# Patient Record
Sex: Female | Born: 1970 | Race: White | Hispanic: No | Marital: Married | Smoking: Never smoker
Health system: Southern US, Community
[De-identification: ages and names within clinical notes are randomized; demographics above are authoritative.]

## PROBLEM LIST (undated history)

## (undated) DIAGNOSIS — F419 Anxiety disorder, unspecified: Secondary | ICD-10-CM

## (undated) DIAGNOSIS — E282 Polycystic ovarian syndrome: Secondary | ICD-10-CM

## (undated) DIAGNOSIS — Z8052 Family history of malignant neoplasm of bladder: Secondary | ICD-10-CM

## (undated) DIAGNOSIS — R51 Headache: Secondary | ICD-10-CM

## (undated) DIAGNOSIS — Z808 Family history of malignant neoplasm of other organs or systems: Secondary | ICD-10-CM

## (undated) DIAGNOSIS — R091 Pleurisy: Secondary | ICD-10-CM

## (undated) DIAGNOSIS — IMO0002 Reserved for concepts with insufficient information to code with codable children: Secondary | ICD-10-CM

## (undated) DIAGNOSIS — N979 Female infertility, unspecified: Secondary | ICD-10-CM

## (undated) DIAGNOSIS — G43909 Migraine, unspecified, not intractable, without status migrainosus: Secondary | ICD-10-CM

## (undated) DIAGNOSIS — R519 Headache, unspecified: Secondary | ICD-10-CM

## (undated) HISTORY — PX: COLON SURGERY: SHX602

## (undated) HISTORY — DX: Reserved for concepts with insufficient information to code with codable children: IMO0002

## (undated) HISTORY — DX: Headache, unspecified: R51.9

## (undated) HISTORY — DX: Pleurisy: R09.1

## (undated) HISTORY — DX: Polycystic ovarian syndrome: E28.2

## (undated) HISTORY — PX: EXPLORATORY LAPAROTOMY: SUR591

## (undated) HISTORY — DX: Family history of malignant neoplasm of bladder: Z80.52

## (undated) HISTORY — DX: Female infertility, unspecified: N97.9

## (undated) HISTORY — DX: Migraine, unspecified, not intractable, without status migrainosus: G43.909

## (undated) HISTORY — DX: Anxiety disorder, unspecified: F41.9

## (undated) HISTORY — DX: Family history of malignant neoplasm of other organs or systems: Z80.8

## (undated) HISTORY — DX: Headache: R51

---

## 1998-05-31 ENCOUNTER — Other Ambulatory Visit: Admission: RE | Admit: 1998-05-31 | Discharge: 1998-05-31 | Payer: Self-pay | Admitting: Obstetrics & Gynecology

## 1999-05-31 ENCOUNTER — Encounter (INDEPENDENT_AMBULATORY_CARE_PROVIDER_SITE_OTHER): Payer: Self-pay

## 1999-05-31 ENCOUNTER — Other Ambulatory Visit: Admission: RE | Admit: 1999-05-31 | Discharge: 1999-05-31 | Payer: Self-pay | Admitting: Gastroenterology

## 2000-01-10 ENCOUNTER — Other Ambulatory Visit: Admission: RE | Admit: 2000-01-10 | Discharge: 2000-01-10 | Payer: Self-pay | Admitting: Obstetrics & Gynecology

## 2004-06-26 ENCOUNTER — Other Ambulatory Visit: Admission: RE | Admit: 2004-06-26 | Discharge: 2004-06-26 | Payer: Self-pay | Admitting: Obstetrics & Gynecology

## 2008-05-20 ENCOUNTER — Inpatient Hospital Stay (HOSPITAL_COMMUNITY): Admission: AD | Admit: 2008-05-20 | Discharge: 2008-05-23 | Payer: Self-pay | Admitting: Obstetrics & Gynecology

## 2008-05-24 ENCOUNTER — Encounter: Admission: RE | Admit: 2008-05-24 | Discharge: 2008-06-08 | Payer: Self-pay | Admitting: Obstetrics & Gynecology

## 2008-05-29 ENCOUNTER — Inpatient Hospital Stay (HOSPITAL_COMMUNITY): Admission: AD | Admit: 2008-05-29 | Discharge: 2008-06-10 | Payer: Self-pay | Admitting: Obstetrics and Gynecology

## 2008-05-30 ENCOUNTER — Encounter: Payer: Self-pay | Admitting: Obstetrics and Gynecology

## 2008-06-01 ENCOUNTER — Encounter (INDEPENDENT_AMBULATORY_CARE_PROVIDER_SITE_OTHER): Payer: Self-pay | Admitting: Obstetrics and Gynecology

## 2010-01-09 IMAGING — CT CT ABDOMEN W/ CM
3 of 4 series · 13 of 32 positions shown, 18 images · IV contrast (40ML OMNI-MIX & 150ml omni/300%)
Comparison: None

CT ABDOMEN

CLINICAL DATA: Abdominal pain

CT ABDOMEN AND PELVIS WITH CONTRAST
TECHNIQUE: Multidetector CT imaging of the abdomen and pelvis was
performed using the standard protocol following bolus
administration of intravenous contrast.
Contrast: 150 ml of omni 300

[Series 2: abd pelvis · axial · 0.75mm/px · z∈[-380,-85]mm · 4 of 96 slices shown, 9 images]
[im 20/96  soft-tissue]
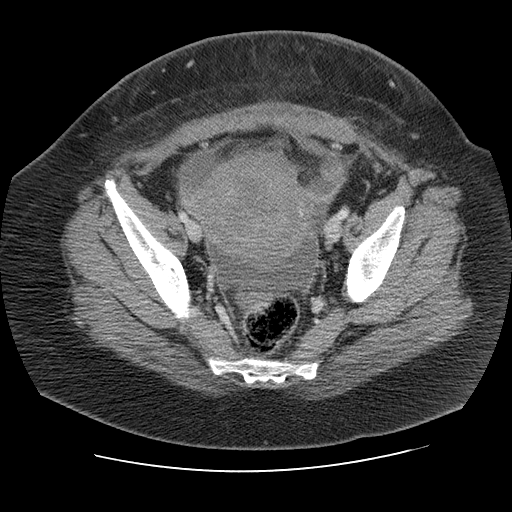
[im 20/96  lung]
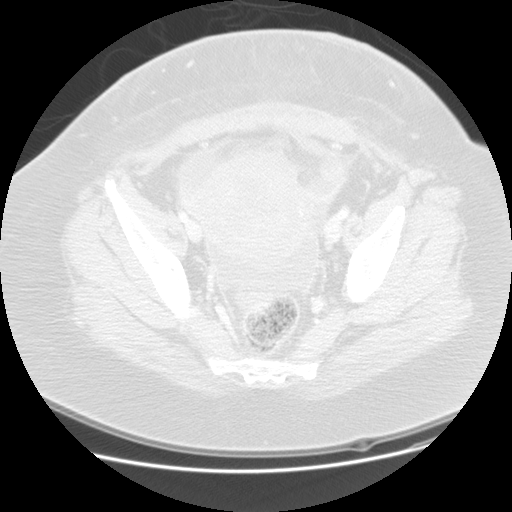
[im 20/96  bone]
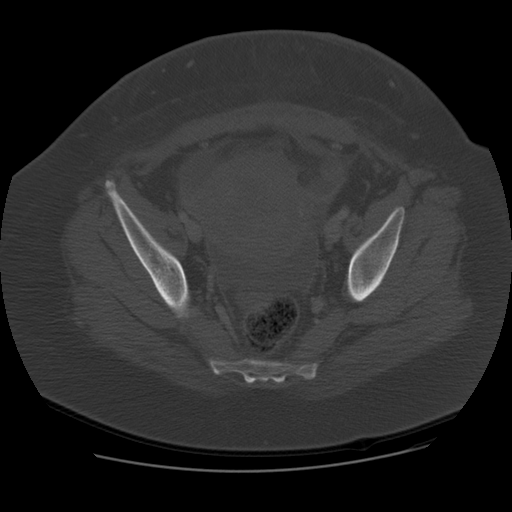
[im 39/96  soft-tissue]
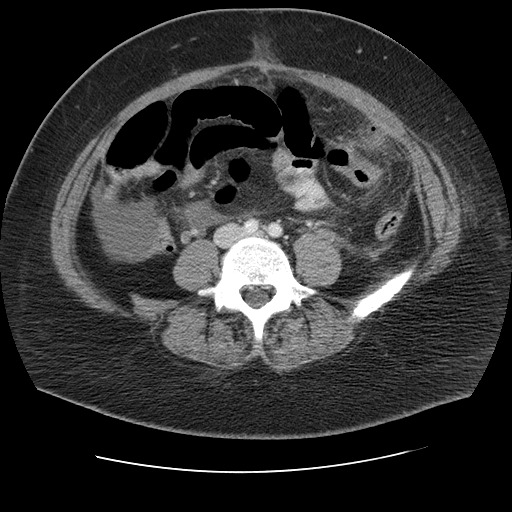
[im 39/96  lung]
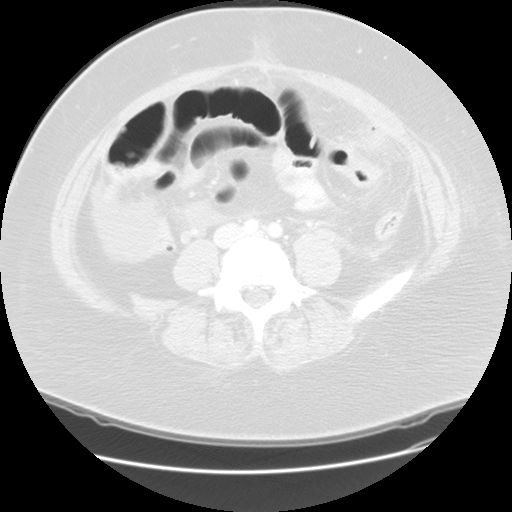
[im 58/96  soft-tissue]
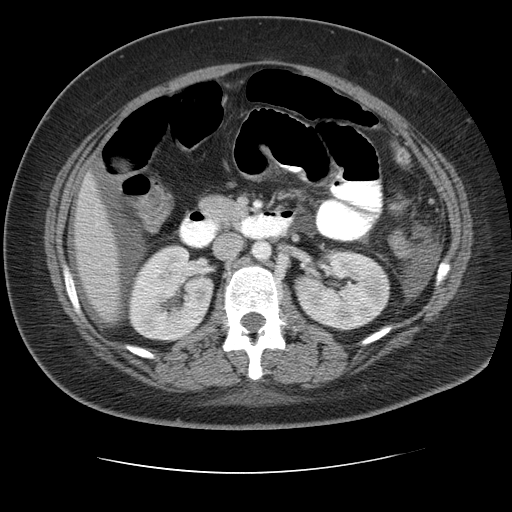
[im 58/96  lung]
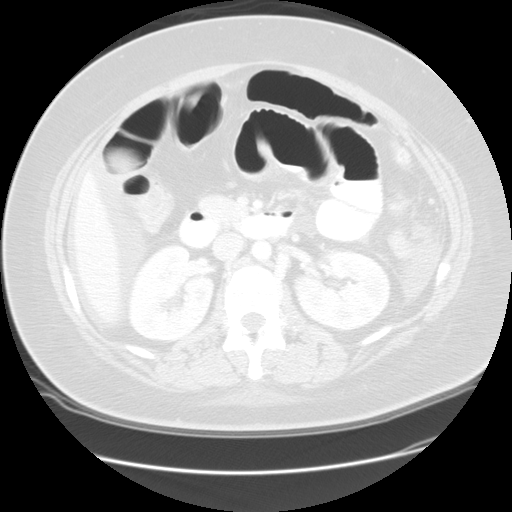
[im 77/96  soft-tissue]
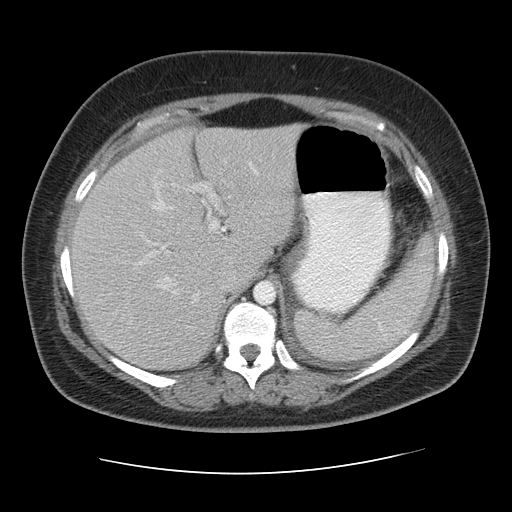
[im 77/96  lung]
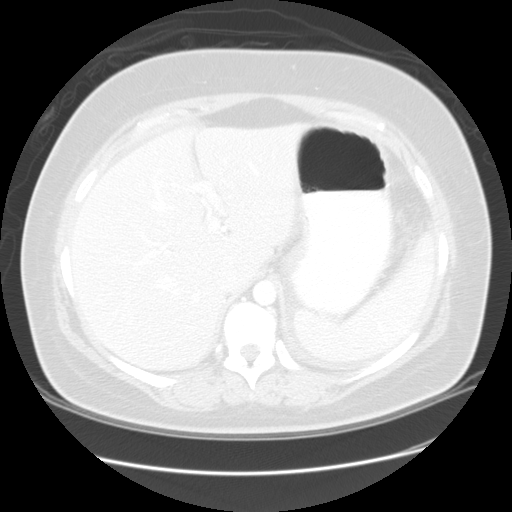

[Series 5: renal delay · axial · delayed · 0.70mm/px · z∈[-335,-230]mm · 2 of 65 slices shown]
[im 22/65  soft-tissue]
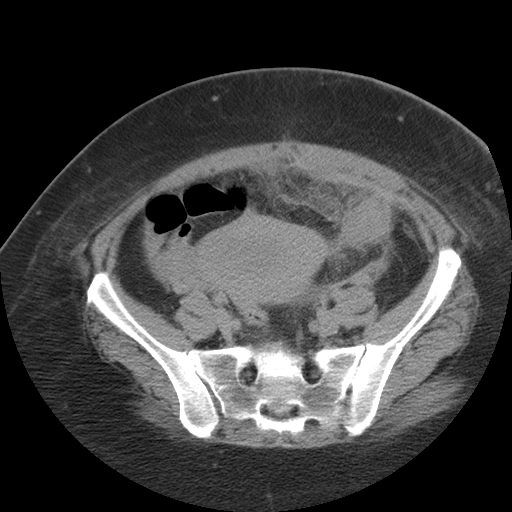
[im 43/65  soft-tissue]
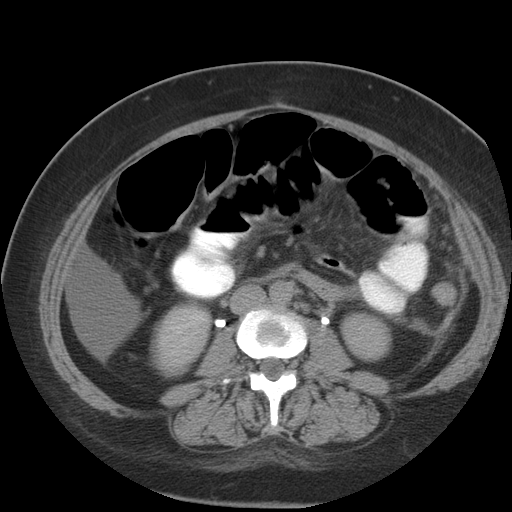

[Series 401: reformatted · sagittal · 1.00mm/px · 7 of 190 slices shown]
[im 18/190  soft-tissue]
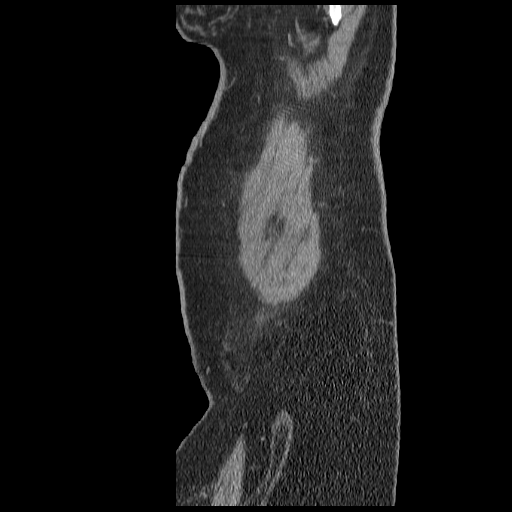
[im 35/190  soft-tissue]
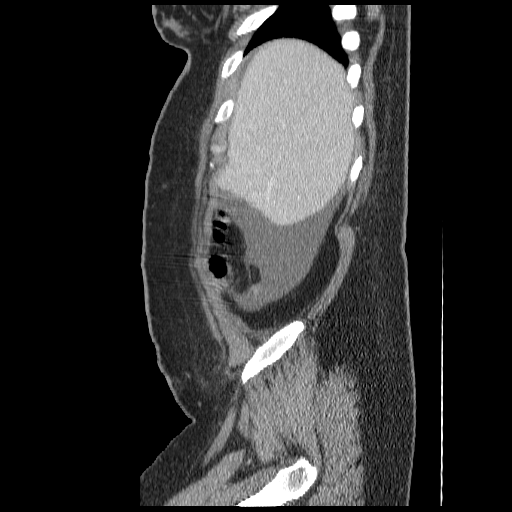
[im 69/190  soft-tissue]
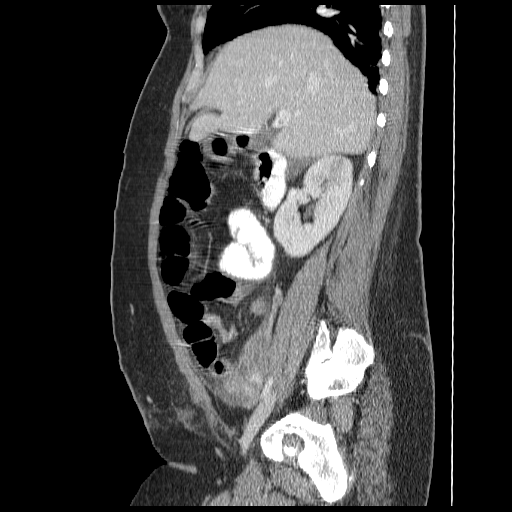
[im 86/190  soft-tissue]
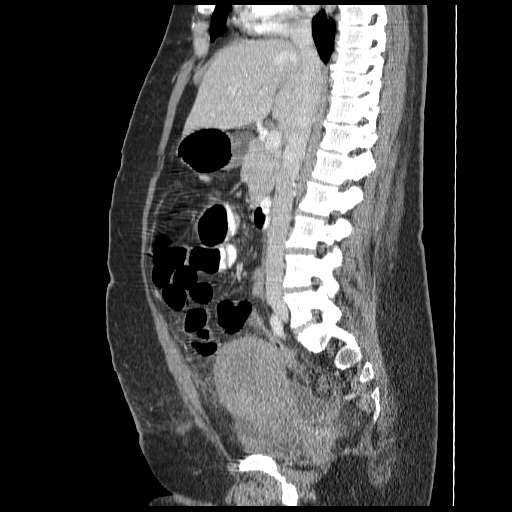
[im 104/190  soft-tissue]
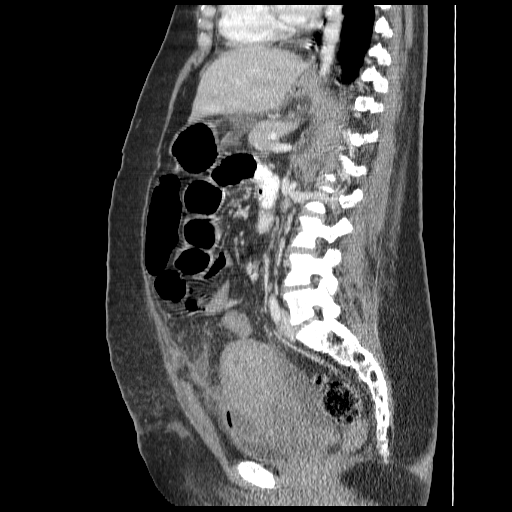
[im 121/190  soft-tissue]
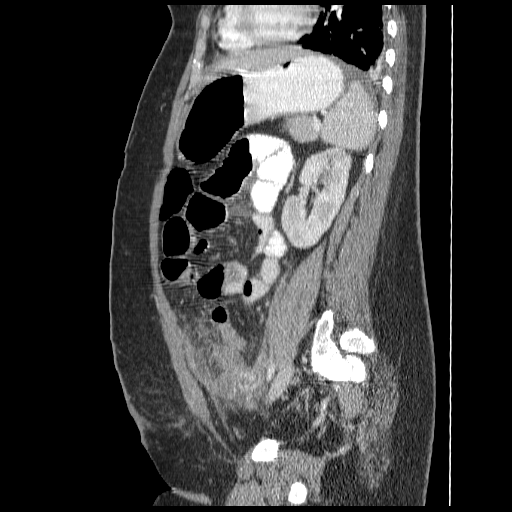
[im 155/190  soft-tissue]
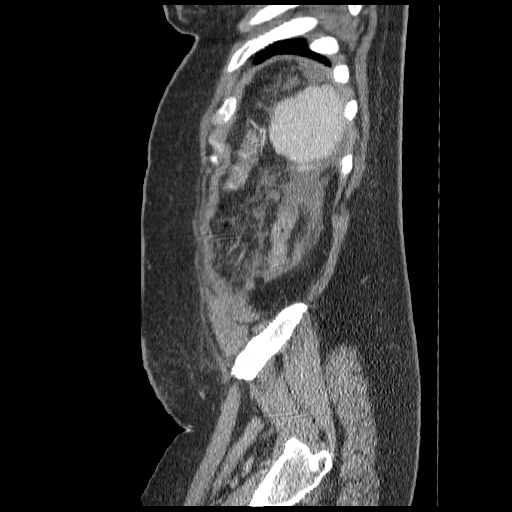

[13 of 32 positions shown; findings below may reference images not displayed]

FINDINGS: Dependent changes are noted at the lung bases

The liver is normal.

A moderate amount of peri hepatic ascites is noted.

The splenic parenchyma is negative.  There is perisplenic ascites.

Pancreas normal.

Gallbladder normal.

No biliary ductal dilatation.

The adrenal glands are normal.

Both kidneys are unremarkable.

The upper abdominal bowel loops are increased in caliber small
bowel loops measure up to 3.8 cm in diameter.

Within the left lower quadrant there is a peripherally enhancing
fluid collection which measures 3.9 x 3.0 cm.  This is along the
undersurface of the left lower quadrant abdominal wall.
IMPRESSION: 1.  Left lower quadrant fluid collection likely represents an
abscess.
2.  Postoperative ileus versus bowel obstruction.
3.  Diffuse upper abdominal ascites.

CT PELVIS
FINDINGS: There is moderate loculated fluid within the cul-de-sac.
This fluid collection has peripherally enhancing margins measuring
7.5 x 2.8 cm.

Along the undersurface of the ventral wall of the pelvis there is a
1.6 x 5.6 cm fluid collection.

A third abscess is identified between the posterior wall of the
urinary bladder and the anterior wall of the uterus.  This is best
seen on the sagittal image measuring 1.9 x 4.7 cm.

The pelvic bowel loops are unremarkable.

The uterus has an unremarkable appearance for the early postpartum
state.

There is no pelvic mass or adenopathy.
IMPRESSION: 1.  There are several focal fluid collections identified which are
worrisome for abscess sees.  The largest is in the cul-de-sac
followed by a thin fluid collection which extends along the
undersurface of the ventral pelvic wall. I also suspect there is an
abscess between the urinary bladder and uterus.  2.  Ascites.

Critical test results telephoned to Dr. Neftaly at the time of
interpretation on 05/29/2008 at 11 pm.

## 2010-12-19 NOTE — Op Note (Signed)
Toni Bowman, Toni Bowman              ACCOUNT NO.:  [id number]   MEDICAL RECORD NO.:  [id number]          PATIENT TYPE:  INP   LOCATION:  9103                          FACILITY:  [facility]   PHYSICIAN:  Gerrit Friends. Aldona Bar, M.D.   DATE OF BIRTH:  [birth year]   DATE OF PROCEDURE:  05/20/2008  DATE OF DISCHARGE:                               OPERATIVE REPORT   The patient's age 40   PREOPERATIVE DIAGNOSIS:  A 38-week intrauterine pregnancy, failure to  progress in labor.   POSTOPERATIVE DIAGNOSIS:  A 38-week intrauterine pregnancy, failure to  progress in labor plus delivery of 6-pound and 14 ounces female infant,  Apgars 9 and 9.   PROCEDURE:  Primary low transverse cesarean section.   SURGEON:  Gerrit Friends. Aldona Bar, MD   ANESTHESIA:  Epidural.   HISTORY:  This 40 year old gravida 1, para 0 was admitted on the morning  of May 20, 2008, at 70 weeks' gestation with spontaneous rupture of  membranes.  At the time of admission, her cervix was 2-3 cm dilated with  vertex at -3 station.  The patient had a positive strep culture in her  urine from urinalysis early in pregnancy and was started on Ancef  because of an allergy to PENICILLIN.  She was started on Pitocin  augmentation and eventually had an IUPC placed to document contractions.  During the day of May 20, 2008, she labored, but progressed no  further than 4-cm of dilatation with vertex remaining at -2 to -3  station.  When checked again at about 7:30 p.m., the cervix did not  change and unfortunately the anterior lip of the cervix was thickening  and although fetal heart remained reactive.  Because of the patient's  failure to progress, decision was made to proceed to delivery by low  transverse cesarean section.   The patient was taken to the operating room with Foley catheter in place  and her epidural was augmented.  Once she was placed on the operating  table and good anesthetic levels were documented, she was prepped and  draped  in usual fashion and procedure was begun.   A Pfannenstiel incision was made with minimal difficulty dissected down  through the subcutaneous tissue to fascia, which was incised in low  transverse fashion as well.  Subfascial space created inferiorly and  superiorly, muscles were separated in the midline and peritoneum was  identified and entered appropriately with care taken to avoid the bowel  superiorly and the bladder inferiorly.  At this time, the vesicouterine  peritoneum was identified and incised in low transverse fashion, pushed  off the lower uterine segment with ease, and then sharp incision into  the uterus in a low transverse fashion was made, and extended laterally.  Delivery of viable female infant, which cried spontaneously once it was  carried out, the vertex position with the aid of the vacuum extractor.  After, the cord was clamped and cut, the infant was passed off the  awaiting team handed up by Dr. Alison Murray and ultimately taken to the  nursery in good condition.  Subsequent weight was found to  be 6 pounds  and 14 ounces, the infant was female, and Apgars were 9 and 9.   Placenta was delivered intact and placenta was passed off as the patient  was a cord blood donor.  The uterus at this time was exteriorized and  rendered free of any remaining products of conception.  Then, the  uterine incision was closed using a single layer of #1 Vicryl in a  running locking fashion and this was oversewn with several figure-of-  eight #1 Vicryl as well.  Additional hemostasis was achieved with  additional suture placement and with using the Bovie.  Once the incision  was noted to be well approximated and dry.  The tubes and ovaries were  noted to be normal.  The abdomen was lavaged of all free blood and clot  and the uterus was replaced in the abdominal cavity.  All counts at this  time were noted to be correct and no foreign bodies were noted be  remaining in the abdominal cavity.   At this time, closure of the abdomen  was carried out in layers.  The abdominal peritoneum was closed with 0  Vicryl in a running fashion.  Muscle secured with same.  Assured of good  subfascial hemostasis.  The fascia was then reapproximated using 0  Vicryl from angle to midline bilaterally.  The subcutaneous tissues were  rendered hemostatic and reapproximated using 2-0 plain in an interrupted  fashion.  The skin was then closed with staples and sterile pressure  dressing was applied.  The patient at this time was transported to  recovery in satisfactory condition, having tolerated the procedure well.  Estimated blood loss 700 mL.  All counts correct x2.  The conclusion of  procedure both mother and baby were doing well in the respective  recovery areas.   IN SUMMARY:  This patient presented with spontaneous rupture of  membranes at 38 weeks' gestation in spite of Pitocin augmentation failed  to progress in labor.  She has got approximately 4-5 cm of dilatation at  which time her cervix became more edematous and the vertex never  descended below -3, -2 station.  She also was taken to the operating for  primary low transverse cesarean section with delivery of 6 pounds and 14  ounces female infant with good Apgars.  All counts correct x2.  Estimated blood loss 700 mL.      Gerrit Friends. Aldona Bar, M.D.  Electronically Signed     RMW/MEDQ  D:  05/20/2008  T:  05/21/2008  Job:  [id number]

## 2010-12-19 NOTE — Consult Note (Signed)
[name], [name]              ACCOUNT NO.:  [id number]   MEDICAL RECORD NO.:  [id number]          PATIENT TYPE:  INP   LOCATION:  9371                          [facility]ACILITY:  [facility]   PHYSICIAN:  Toni Pancoast. Bowman, M.D.DATE OF BIRTH:  1971/02/27   DATE OF CONSULTATION:  05/29/2008  DATE OF DISCHARGE:                                 CONSULTATION   HISTORY:  Toni Bowman is a 40 year old Caucasian female 10 days after  a C-section for her first pregnancy who was discharged in approximately  2 or 3 day after her C-section and states that she just kind of done  poorly, soreness predominantly lower abdominal on the left and the  right.  Bowels have been quite sluggish and has been on stool softeners.  She states she thinks she actually had a chill probably Tuesday night  and then today, she obviously felt worse and then came to the emergency  room here at [facility] approximately 4 p.m.  She was seen by Dr.  Edward Bowman and she was definitely tender in the lower abdomen and also tender  guarding the upper abdomen and laboratory studies were obtained.  White  count however was only 7100, and her hematocrit was 30.6 and hemoglobin  of 10.  The patient preoperatively before her section had a hematocrit  of 34 and then postoperative 2 days over 24 hour after section, her  hematocrit was 27.  The patient was given pain medication and then a CT  of the abdomen and pelvis was performed and that shows lot of fluid in  the pelvis.  It looks like it is probably blood is definite inflammatory  changes more in the left lower quadrant and in the posterior pelvis  area.  The area has a little fluid in the upper abdomen.  The nature of  the fluid on the CT by the radiologist says it is not acute blood, might  __________ probably 15 or something.   Her vital signs when she first arrives her temperature of 101.7, pulse  is 101, and blood pressure 111/53.  She has now received 2 units of IV  fluids and  her temperature was 100.1, blood pressure 133/53, and her  pulse of 90.  She has received pain medication several occasions and I  have called and talked with the radiologist.  Both the radiologist and I  feel that the fluid that we are seeing looks like to me old blood and  there is a little bit more changes like possible development of abscess  predominately in the left lower quadrant.  The patient states that the  pain has kind of gradually increased.  It has gotten much worse today,  but it is not like she was feeling fine yesterday and then suddenly  today is quite sore as if she has a perforation of her intestines and  also on the CT, there was no evidence of any extraluminal free air.  There was no evidence of any diverticulitis, appendicitis, or any acute  changes of the bowel that we can actually see if she does have kind of  an ileus pattern, however.  I think then I would load her up with  antibiotics.  She is allergic to PENICILLIN and we have started the  Cipro at this time plus we have given her Flagyl and we are going to  recheck a CBC.  I think in the morning, she isgoing to need to be  reassessed and hopefully, she will be better.  If she is better than I  would see if we can sample one of these fluid collections in the pelvis  for culture and sensitivity and just continue with the antibiotics.  If  she becomes hemodynamically unstable or she is not improving on 6-8  hours of antibiotics, then the possibility of a laparotomy definitely  may be indicated.  I think that the amount of blood that we are seeing  that it is unlikely that a laparoscopic examination is going to allow [place]  to inspect everything, it could certainly possibly first be attempted,  but most likely would need to be a full laparotomy.  The patient and her  mother have been informed of our opinions and are quite  hopeful that surgery will not be needed.  I will follow along and we  will recheck her in the  morning and please feel free to call me if her  condition worsens this evening and I will assist on the laparotomy.   IMPRESSION:  Probably infected hematoma now 10 days following her C-  section for first pregnancy.           ______________________________  Toni Pancoast. Zachery Bowman, M.D.     WJW/MEDQ  D:  05/30/2008  T:  05/30/2008  Job:  [id number]

## 2010-12-19 NOTE — Op Note (Signed)
[name], [name]              ACCOUNT NO.:  [id number]   MEDICAL RECORD NO.:  [id number]         PATIENT TYPE:  WINP   LOCATION:                                [facility]   PHYSICIAN:  Anselm Pancoast. 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   ASSISTANTS:  Miguel Aschoff, M.D. and Randye Lobo, MD, they were both  scrubbed in.   HISTORY:  Toni Bowman is a 40 year old Caucasian female who is now  approximately 12 days following a C-section and was readmitted to [facility] on Saturday afternoon when she had increasing abdominal pain.  Her history is that she was discharged in about the second day.  She has  always had a little more pain in the left lower quadrant and problems  with poor bowel function, but her pain became significantly increased on  Saturday and she came to the emergency room here at [facility] on  Saturday afternoon.  She was seen by Dr. Conley Simmonds who was on call.  Dr. Aldona Bar had actually done her suction, and her wound looked fine.   LABORATORY STUDIES:  White count was not even elevated, but she did have  a kind of a mild tachycardia and she was definitely tender in all 4  quadrants of her abdomen.  A plain abdominal film was performed, which  showed that she was gaseous.  Then, a CT was performed and the CT showed  fluid collections in the left pelvis behind the uterus, a little bit in  the right and the left and right upper abdomen, which the density was  not that of blood and her area adjacent to the sigmoid colon, there was  a little pocket that looked like there was a little rim around it like  pus.  This was truly an abscess.  It appeared to be separate from both  the colon and her left tube and ovary, but it was  a kind of down in the  genital area.  We started her on antibiotics, and I saw her  approximately at midnight and on review of the CTs and etc., felt that  this was most likely a peritonitis whether it was an intestinal or a  post GYN infection I could not tell, but I felt that the best would be  to have a percutaneous sampling of the fluid done the next day and this  was arranged and done at [facility].   The Gram-stain results were not reported and I talked to the technician  the following day, and she said that they could see what they thought  was bacteria, but they could not tell.  So, therefore they did not  report anything, and then at the first reading, which I think it was  about 12 hours after the plate in, they were stating it was no growth.  She was placed on Cipro and  Flagyl, being allergic to penicillin.  I saw  her approximately 24 hours later, and she states she was feeling better.  She thought her pain was about 7 or it had been 10, and she was trying  sips of liquids and she was actually passing a little bit of flatus.  The drainage, however, felt smell, as it even had colonic organisms in  it, and I sent a second culture from the [place] reservoir and this today  showing both Gram-positive and Gram-negative organisms.   She had some fever last night.  Her white count has been slightly  elevated since the percutaneous drainage and with no improvement, the  bacteria of multiple organisms, I felt that an exploratory laparotomy  was indicated.  I talked with both the patient, her parents, and husband  about the findings reviewed the CTs and then she might need a colostomy.  Dr. Edward Jolly who has been seeing her all of this hospitalizations, saw her  again today and Dr. Tenny Craw, gynecologist on call and we are all in  agreement that a laparotomy is needed.  The patient was taken to surgery  after signing the permit and the time-outs etc. are completed.  She is  on Cipro and  Flagyl, and the lower Pfannenstiel incision appears to be  healing nicely.   The patient was positioned on the OR table after induction of general  anesthesia, her endotracheal tube was little difficult to in place and  then the Optiview was used and then a nasogastric tube was placed into  the stomach.  A Foley catheter was inserted and the abdomen was prepped  widely where the drain is in the left lower quadrant.  We left the drain  in place, but removed the little attaching device and prepped it in the  field.  Upon opening the midline incision which I did and she has got  PIA stockings, we carefully entered into the peritoneal cavity and they  reassessed kind a thin fluid.  It does not really smell that feculent  and most of the fluid was in the left lower quadrant and that was frank  infection type like you say were ruptured appendicitis, but is  predominantly in the left lower quadrant.   The inspection of where the drain was placed, the drain was adjacent to  the colon, but it is definitely not into the colon and we cannot see any  obvious areas of drainage or hole in the colon from the drain or  definite diverticulum at this.  I tagged the area with a stitch, and  then went through a kind of general expiration.  The most of the fluid  in the pelvis which was cultured aerobic and anaerobically was washed  out, and then she had probably a liter or half liter of kind of thinner  fluid in the left upper quadrant and right upper quadrant.  In the right  lower quadrant, was the same fluid, but the pathology certainly appears  to be in the left lower quadrant.  We then find the appendix, it is  normal looking at the uterus both from the front and posterior.  The  suture line appears to be healing nicely.  The uterus is a little bit  spongy, but nothing that we think is obviously an infection and the  right tube and the left tube and ovaries look like we think normal for a  10-12 day  postpartum in a 40 year old female.   Then, we  have run the small bowel, it is normal, and the little peel  from around the distal area of the colon that had been so well,  demonstrated on original CT.  In looking at that, there is a little  area, that looks like a single diverticulum off the colon that looks  like there was a little opening, kind a going at, took TA 60 stapler and  fired it and removed it, and I think there is a little diverticulum in  this.  I then invaginated this little short about a cm and a half staple  line with Lembert sutures with 3-0 silk, and asked Dr. Colin Benton to scrub in  to get her assessment.  The options, of course ,would be to go ahead and  do a limited resection and colostomy on Hartmann or is it, is where we  think the problem is, since we are not seeing any further contamination,  etc.  Could this be stopped at this point, leave a drain in, and keep  her on broad antibiotics.   Dr. Colin Benton feels that colostomy is not indicated also, and therefore we  washed everything thoroughly.  I did put a Blake drain in lateral view  of omentum, which was placed back over this area of the sigmoid colon  and the small bowel I think is an anatomical position.  The NG tube well  in the stomach.  We had inspected and looked at the duodenum.  We are  doing it through a lower incision and she is a large woman, so I could  not get the best inspection of the upper abdomen, but we are certain all  the pathology appears to be in the pelvis.  The midline incision was  then closed with the looped double 0 PDS, and then the skin was loosely  approximated with staples and packed with iodoform gauze.  It was  basically left open, and a sterile occlusive dressing applied.   The drain had been sutured to the skin and it was hooked up to the  reservoir.  Estimated blood loss was probably about 50 mL and the  hemoglobin is down.  I think this is going to be more related to the  chronic  nature in the recent postpartum.  I do not think that she has  got active bleeding at this time.  The patient will go back up to the  ICU.  She  will keep the NG tube in until she is definitely has a soft abdomen, no  evidence of any gaseous abdominal distention, and the family understands  that if we were wrong and there is stool drainage from the drain, we  will have no choice, but take her back and do end-colostomy Hartmann,  which hopefully will not be needed.           ______________________________  Anselm Pancoast. Zachery Dakins, M.D.     WJW/MEDQ  D:  06/01/2008  T:  06/02/2008  Job:  [id number]   cc:   Randye Lobo, M.D.  Fax: [phone]

## 2010-12-22 NOTE — Discharge Summary (Signed)
Toni Bowman, Toni Bowman              ACCOUNT NO.:  [id number]   MEDICAL RECORD NO.:  [id number]          PATIENT TYPE:  INP   LOCATION:  9103                          [facility]ACILITY:  [facility]   PHYSICIAN:  Malva Limes, M.D.    DATE OF BIRTH:  Jul 20, 1971   DATE OF ADMISSION:  05/20/2008  DATE OF DISCHARGE:  05/23/2008                               DISCHARGE SUMMARY   FINAL DIAGNOSES:  Intrauterine pregnancy at [redacted] weeks gestation,  spontaneous rupture of membranes, failure to progress, positive group B  strep.   PROCEDURE:  Primary low-transverse cesarean section.   SURGEON:  Gerrit Friends. Aldona Bar, MD   COMPLICATIONS:  None.   This is 40 year old G1, P0 presents at [redacted] weeks gestation with  spontaneous rupture of membranes.  The patient's antepartum course up to  this point had been complicated by positive group B strep culture that  was found on her urine culture in her first trimester and the patient  was also advanced maternal age.  She had a first trimester screen that  was within normal limits, but did not have amniocentesis, otherwise the  patient's antepartum course has been uncomplicated.  The patient was  admitted on May 20, 2008, with rupture of membranes.  Cervix was re-  dilated to 2-3 cm at a -3 station.  The patient was started on Ancef  secondary to positive group B strep culture and her allergy to  PENICILLIN.  The patient was also started on Pitocin for augmentation.  The patient's labor __________but cervix did not dilate any further than  4 cm.  By 7:30 that night, there was still no change.  The anterior lip  of the cervix was thickening.  Fetal heart tones remained stable, but  because of the failure to progress, the decision was made to proceed  with a cesarean section.  The patient was taken to the operating room on  May 20, 2008, by Dr. Annamaria Helling where a primary low-transverse  cesarean section was performed with the delivery of a 6-pound 14-ounce  female infant  with Apgars of 9 and 9.  The delivery went without  complications.  The patient's postoperative course was benign without  any significant fevers.  The patient did have some symptoms of  questionable postpartum depression.  She was sent home on postoperative  day #3 on a regular diet, told to decrease activities.  She was given a  prescription for Zoloft 50 mg to start daily to help with this  postpartum depression, given Percocet 1-2 every 4-6 hours as needed for  her pain, and was to follow up in our office in 4 weeks.  Instructions  and precautions were reviewed with the patient.   LABORATORY DATA:  On discharge, the patient had a hemoglobin of 9.4,  white blood cell count of 15.2, and platelets of 169,000.      Toni Bowman, P.A.-C.      ______________________________  Malva Limes, M.D.    MB/MEDQ  D:  07/06/2008  T:  07/07/2008  Job:  [id number]

## 2010-12-22 NOTE — Discharge Summary (Signed)
NAMEDAVINIA, RICCARDI              ACCOUNT NO.:  [id number]   MEDICAL RECORD NO.:  [id number]          PATIENT TYPE:  INP   LOCATION:  9320                          FACILITY:  [facility]   PHYSICIAN:  Anselm Pancoast. Weatherly, M.D.DATE OF BIRTH:  07-07-1971   DATE OF ADMISSION:  05/29/2008  DATE OF DISCHARGE:  06/10/2008                               DISCHARGE SUMMARY   DISCHARGE DIAGNOSES:  Intraabdominal infection, status post cesarean  section.  Etiology of infection is probably microperforation of her  sigmoid colon.  The patient is approximately 10 days post C-section.   OPERATIVE PROCEDURE:  Exploratory laparotomy, drainage of left pelvic  abscess, and suture repair area of sigmoid colon.   HISTORY:  Toni Bowman is a 40 year old Caucasian female who is  readmitted 10 days after a C-section to [facility] on a Saturday  evening with severe increase in left lower abdominal pain.  She stated  that she is always had more pain on the left than the right side of her  abdominal and with this increasing in intensity and fever, she was  admitted on a Saturday evening, seen in the ER by Dr. Conley Simmonds, Dr.  Aldona Bar who had actually done her section.  The patient had a mild  tachycardia.  White count was not elevated initially and plain abdominal  films performed, which showed her to have an extremely gaseous colon.  A  CT was performed, which showed fluid collections in the left pelvis,  behind the uterus, a little fluid on the right and the density, it was  felt that not to be blood, but it was not really an obvious walled off  abscess.  The patient was started on antibiotics.  The following day had  an aspiration of fluid by the radiologist, which was sent to [facility].  The Gram stain was not reported, but the following day, we  could see bacteria.  She was on Cipro and Flagyl.  I saw her back 24  hours later, I had seen her originally in the emergency room and she had  fever.  White  count was becoming more elevated and the Pfannenstiel  incision appeared to be healing without problems.  She was taken to  surgery and Dr. Edward Jolly assisted and we found fluid in the left pelvis.  It was a little area that look like possibly a little microperforation  of the sigmoid colon in the descending sigmoid colon junction area and I  suture repaired this and had Dr. Colin Benton come in and also explore since we  obviously had bacteria within the fluid that appears to be colonic in  origin and there was no other evidence of diverticulitis or free  perforation that we could identify.  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  I had placed a Blake drain.  Her  white count postoperatively remained in approximately 10,000 range.  The  culture showed Bacteroides and she continued  to have abdominal pain  predominantly on the left and I sent her down for a Gastrografin enema  that showed no evidence of any leakage of the colon where the little  area was repaired.  The drain which had been frankly purulent drainage  initially became clear.  She became afebrile, her pulse improved.  We  had started on a PICC line and we talked about possibly to start on  hyperalimentation but she improved and this was never done.  Her midline  incision appears to be healing nicely and we removed the PICC line and  discharged her on Cipro 500 mg p.o. b.i.d. for 7 days, Flagyl 500 mg  q.i.d. for 7 days and I will see her in the office next Monday.  Check  laboratory studies and remove the Blake drain if there is no increased  drainage.  She has had a significant history of chronic constipation  following the C-section, and I think that the etiology of the infection  was probably a little microperforation of the colon even though that we  could not  definitely identify true hole in the colon at the time of  surgery.  There was an area medially adjacent to the fluid collection.  However, that look like it was the area of perforation that I suture  repaired.  The patient was discharged  in an improved condition and I think since this culture was Bacteroides  and she had no problems with any endometriosis of her uterus following  the C-section that the colon is the most likely origin.  Her appendix  showed no evidence of any inflammation at the time of surgery.      Anselm Pancoast. Zachery Dakins, M.D.  Electronically Signed     WJW/MEDQ  D:  11/15/2008  T:  11/16/2008  Job:  [id number]   cc:   Gerrit Friends. Aldona Bar, M.D.  Fax: [phone]

## 2010-12-22 NOTE — Discharge Summary (Signed)
Toni Bowman, Toni Bowman              ACCOUNT NO.:  [id number]   MEDICAL RECORD NO.:  [id number]          PATIENT TYPE:  INP   LOCATION:  9320                          FACILITY:  [facility]   PHYSICIAN:  Leilani Able, P.A.-C.DATE OF BIRTH:  [birth year]   DATE OF ADMISSION:  05/29/2008  DATE OF DISCHARGE:  06/10/2008                               DISCHARGE SUMMARY   FINAL DIAGNOSES:  Ten-day postop from her C-section, left lower quadrant  pain, abdominal abscess.   COMPLICATIONS:  None.   This 40 year old G1, P1 presents on 10 days postop from a C-section  secondary to failure to dilate.  The patient presents with some burning  in her lower abdomen.  She said she has not been feeling well since  delivery.  Pain has worsened and is now centered in the left lower  quadrant.  She is having some vomiting at this time.  The patient's temp  upon admission is 101.7, blood pressures are within normal limits.  The  patient did have some tenderness with guarding and rebound in her lower  abdomen as well as a tender uterus and adnexa.  Lab work was obtained.  The patient was admitted.  CT of pelvis and abdomen were ordered and a  surgical consult was obtained.  The CT scan showed lots of fluid in the  pelvis, looks like old blood and the possible development of an abscess  in her left lower quadrant.  No signs of anything acute going on and  blood was thought to have been there for a while and nothing is acute.  The patient was hemodynamically stable at this point.  She was started  on IV antibiotics.  She is allergic to PENICILLIN, therefore was started  on Cipro, Flagyl, and Ancef.  The patient is positive group B strep and  penicillin allergy, but tolerated the Ancef in labor.  The patient is  also having some constipation.  The patient does have a drain in her  incision.  She is continued to be watched closely and followed by the  surgical team.  She did have the abscess drained.  She was  continued on  her antibiotics and continues to spike some temperatures despite her  adequate antibiotics.  The patient did have a PICC line placed on  June 04, 2008, that went without complications.  She was continued on  her Cipro and Flagyl.  By June 06, 2008, the patient started to feel  little bit better.  She was able to advance her diet slightly to some  fluid.  PICC line was able to be removed, and the patient was felt ready  for discharge by April 10, 2008.  She had been afebrile for 24 hours.  She was feeling much better.  She was able to tolerate oral Cipro and  Flagyl.  She was sent home with another 7 days of it.  She was to  present on Monday for some more lab work, to use MiraLax as needed for  constipation, and of course to call with any temperatures, increased  pain, or problems.  No surgery was needed,  and the patient was sent home  without any serious complications.  She was to follow up with the  surgical team as directed.   LABORATORY ON DISCHARGE:  By discharge, the patient had a hemoglobin of  7.0, white blood cell count of 8.5 which was down from a high of 15.8,  platelets of 529,000, no shift in her white blood cell count by  discharge, as well as normal liver function tests.  Blood culture did  return showing Propionibacterium species.  Urine culture was negative,  and you can see the abscess cultures are in the chart.      Leilani Able, P.A.-C.     MB/MEDQ  D:  06/22/2008  T:  06/23/2008  Job:  [id number]

## 2011-03-29 ENCOUNTER — Other Ambulatory Visit: Payer: Self-pay | Admitting: Obstetrics and Gynecology

## 2011-04-03 ENCOUNTER — Other Ambulatory Visit: Payer: Self-pay | Admitting: Obstetrics and Gynecology

## 2011-04-03 DIAGNOSIS — R1903 Right lower quadrant abdominal swelling, mass and lump: Secondary | ICD-10-CM

## 2011-04-06 ENCOUNTER — Ambulatory Visit
Admission: RE | Admit: 2011-04-06 | Discharge: 2011-04-06 | Disposition: A | Discharge status: Home / Self Care | Payer: Commercial Indemnity | Source: Ambulatory Visit | Attending: Obstetrics and Gynecology | Admitting: Obstetrics and Gynecology

## 2011-04-06 DIAGNOSIS — R1903 Right lower quadrant abdominal swelling, mass and lump: Secondary | ICD-10-CM

## 2011-04-06 MED ORDER — IOHEXOL 300 MG/ML  SOLN
100.0000 mL | Freq: Once | INTRAMUSCULAR | Status: AC | PRN
  Administered 2011-04-06: 100 mL via INTRAVENOUS

## 2011-04-28 ENCOUNTER — Emergency Department (INDEPENDENT_AMBULATORY_CARE_PROVIDER_SITE_OTHER): Payer: Commercial Indemnity

## 2011-04-28 ENCOUNTER — Encounter: Payer: Self-pay | Admitting: *Deleted

## 2011-04-28 ENCOUNTER — Emergency Department (HOSPITAL_BASED_OUTPATIENT_CLINIC_OR_DEPARTMENT_OTHER)
Admission: EM | Admit: 2011-04-28 | Discharge: 2011-04-28 | Disposition: A | Discharge status: Home / Self Care | Payer: Commercial Indemnity | Attending: Emergency Medicine | Admitting: Emergency Medicine

## 2011-04-28 DIAGNOSIS — M79609 Pain in unspecified limb: Secondary | ICD-10-CM

## 2011-04-28 DIAGNOSIS — M25571 Pain in right ankle and joints of right foot: Secondary | ICD-10-CM

## 2011-04-28 DIAGNOSIS — X58XXXA Exposure to other specified factors, initial encounter: Secondary | ICD-10-CM

## 2011-04-28 DIAGNOSIS — M25579 Pain in unspecified ankle and joints of unspecified foot: Secondary | ICD-10-CM | POA: Insufficient documentation

## 2011-04-28 MED ORDER — IBUPROFEN 600 MG PO TABS: 600.0000 mg | ORAL_TABLET | Freq: Four times a day (QID) | ORAL | Status: AC | PRN

## 2011-04-28 MED ORDER — OXYCODONE-ACETAMINOPHEN 5-325 MG PO TABS: 2.0000 | ORAL_TABLET | ORAL | Status: AC | PRN

## 2011-04-28 NOTE — ED Notes (Signed)
Right ankle hurts in the center of top of foot. Denies any injury, started yesterday afternoon

## 2011-04-28 NOTE — ED Provider Notes (Signed)
History     CSN: [id number] Arrival date & time: 04/28/2011  1:29 AM  Chief Complaint  Patient presents with  . Ankle Pain    HPI    HPI Comments: 40 year old female previous healthy presents with right ankle pain x2 days now 8/10. Patient states that there was no known injury. She began to have a dull aching sensation in her anterior right ankle. Pain is worse with movement and progressively worsening despite ibuprofen use. She remained in the ambulatory. Denies numbness tingling or weakness of her foot. No history of similar. Denies fevers chills swelling redness. No recent illness. No history of gout. Denies other complaints at this time.  Patient is a 40 y.o. female presenting with ankle pain.  Ankle Pain     History reviewed. No pertinent past medical history.  Past Surgical History  Procedure Date  . Cesarean section   . Exploratory laparotomy     No family history on file.  History  Substance Use Topics  . Smoking status: Never Smoker   . Smokeless tobacco: Not on file  . Alcohol Use: No    OB History    Grav Para Term Preterm Abortions TAB SAB Ect Mult Living                  Review of Systems  Review of Systems  All other systems reviewed and are negative.   except as noted in history of present illness  Allergies  Review of patient's allergies indicates no known allergies.  Home Medications   Current Outpatient Rx  Name Route Sig Dispense Refill  . IBUPROFEN 600 MG PO TABS Oral Take 1 tablet (600 mg total) by mouth every 6 (six) hours as needed for pain. 30 tablet 0  . OXYCODONE-ACETAMINOPHEN 5-325 MG PO TABS Oral Take 2 tablets by mouth every 4 (four) hours as needed for pain. 15 tablet 0    Physical Exam    BP 112/58  Pulse 72  Temp(Src) 98.4 F (36.9 C) (Oral)  Resp 20  Wt 210 lb (95.255 kg)  SpO2 100%  LMP 04/14/2011  Physical Exam  Nursing note and vitals reviewed. Constitutional: She is oriented to person, place, and time. She  appears well-developed.  HENT:  Head: Atraumatic.  Mouth/Throat: Oropharynx is clear and moist.  Eyes: Conjunctivae and EOM are normal. Pupils are equal, round, and reactive to light.  Neck: Normal range of motion. Neck supple.  Cardiovascular: Normal rate, regular rhythm, normal heart sounds and intact distal pulses.   Pulmonary/Chest: Effort normal and breath sounds normal. No respiratory distress. She has no wheezes. She has no rales.  Abdominal: Soft. She exhibits no distension. There is no tenderness. There is no rebound and no guarding.  Musculoskeletal: Normal range of motion.       Right ankle- there is no swelling ecchymosis or deformity. There is no erythema. Full range of motion with minimal pain. + Tenderness to palpation anterior ankle, minimal tenderness to palpation anterior to lateral malleolus. DP and PT intact. Gross sensation intact. There is no obvious effusion  Neurological: She is alert and oriented to person, place, and time.  Skin: Skin is warm and dry. No rash noted.  Psychiatric: She has a normal mood and affect.    ED Course  Procedures (including critical care time)  Labs Reviewed - No data to display Dg Ankle Complete Right  04/28/2011  *RADIOLOGY REPORT*  Clinical Data: Right anterior ankle pain and dorsal foot pain.  RIGHT ANKLE -  COMPLETE 3+ VIEW  Comparison: None.  Findings: There is no evidence of fracture or dislocation.  The ankle mortise is intact; the interosseous space is within normal limits.  No talar tilt or subluxation is seen.  Plantar and posterior calcaneal spurs are incidentally noted.  The joint spaces are preserved.  No significant soft tissue abnormalities are seen.  IMPRESSION: No evidence of fracture or dislocation.  Original Report Authenticated By: Tonia Ghent, M.D.    1. Ankle pain, right    MDM 40 year old female presents with right ankle pain. Differential diagnosis includes tendinitis, fracture, osseous lesion, no suspicion of  septic arthritis, gout.  X-ray reviewed and unremarkable. Will send home with ibuprofen and Percocet. Ace wrap and crutches, ice for supportive care. Follow with her primary care doctor at [facility] medicine as needed.  Stefano Gaul, MD         Forbes Cellar, MD 04/28/11 [phone]

## 2011-05-08 LAB — CROSSMATCH

## 2011-05-08 LAB — CBC
HCT: 25.2 — ABNORMAL LOW
HCT: 25.4 — ABNORMAL LOW
HCT: 26.1 — ABNORMAL LOW
HCT: 30 — ABNORMAL LOW
HCT: 30.5 — ABNORMAL LOW
Hemoglobin: 10 — ABNORMAL LOW
Hemoglobin: 10 — ABNORMAL LOW
Hemoglobin: 11.2 — ABNORMAL LOW
Hemoglobin: 7.2 — CL
Hemoglobin: 8.2 — ABNORMAL LOW
Hemoglobin: 8.6 — ABNORMAL LOW
Hemoglobin: 7.7 — CL
MCHC: 32.7
MCHC: 32.7
MCHC: 32.7
MCHC: 32.9
MCHC: 32.3
MCHC: 32.5
MCV: 82.2
MCV: 82.6
MCV: 82.7
MCV: 82.8
MCV: 83
MCV: 83.1
Platelets: 169
Platelets: 381
Platelets: 408 — ABNORMAL HIGH
Platelets: 422 — ABNORMAL HIGH
Platelets: 433 — ABNORMAL HIGH
Platelets: 434 — ABNORMAL HIGH
Platelets: 439 — ABNORMAL HIGH
Platelets: 550 — ABNORMAL HIGH
Platelets: 529 — ABNORMAL HIGH
Platelets: 570 — ABNORMAL HIGH
RBC: 2.72 — ABNORMAL LOW
RBC: 2.72 — ABNORMAL LOW
RBC: 3.04 — ABNORMAL LOW
RBC: 3.08 — ABNORMAL LOW
RBC: 4.1
RBC: 2.65 — ABNORMAL LOW
RBC: 2.84 — ABNORMAL LOW
RDW: 16 — ABNORMAL HIGH
RDW: 16.3 — ABNORMAL HIGH
RDW: 16.4 — ABNORMAL HIGH
RDW: 17 — ABNORMAL HIGH
RDW: 17.4 — ABNORMAL HIGH
RDW: 17.4 — ABNORMAL HIGH
WBC: 12.6 — ABNORMAL HIGH
WBC: 15 — ABNORMAL HIGH
WBC: 15.2 — ABNORMAL HIGH
WBC: 15.8 — ABNORMAL HIGH
WBC: 7.4
WBC: 8.6
WBC: 8.9
WBC: 9.8
WBC: 8.5

## 2011-05-08 LAB — DIFFERENTIAL
Basophils Absolute: 0
Basophils Absolute: 0
Basophils Absolute: 0
Basophils Relative: 0
Basophils Relative: 0
Basophils Relative: 0
Eosinophils Absolute: 0
Eosinophils Absolute: 0
Eosinophils Absolute: 0
Eosinophils Absolute: 0.1
Eosinophils Relative: 0
Eosinophils Relative: 0
Eosinophils Relative: 0
Eosinophils Relative: 0
Eosinophils Relative: 0
Lymphocytes Relative: 14
Lymphocytes Relative: 5 — ABNORMAL LOW
Lymphocytes Relative: 6 — ABNORMAL LOW
Lymphs Abs: 0.6 — ABNORMAL LOW
Lymphs Abs: 1
Lymphs Abs: 2.1
Monocytes Absolute: 0.2
Monocytes Absolute: 0.5
Monocytes Absolute: 0.7
Monocytes Relative: 4
Monocytes Relative: 4
Monocytes Relative: 7
Neutro Abs: 12.2 — ABNORMAL HIGH
Neutro Abs: 6.8
Neutro Abs: 6.8
Neutro Abs: 6.9
Neutrophils Relative %: 69
Neutrophils Relative %: 81 — ABNORMAL HIGH
Neutrophils Relative %: 92 — ABNORMAL HIGH

## 2011-05-08 LAB — COMPREHENSIVE METABOLIC PANEL
ALT: 13
AST: 31
AST: 17
Albumin: 2.4 — ABNORMAL LOW
Albumin: 1.7 — ABNORMAL LOW
Alkaline Phosphatase: 98
Alkaline Phosphatase: 56
BUN: 5 — ABNORMAL LOW
Calcium: 8.5
Chloride: 109
GFR calc Af Amer: >60
GFR calc Af Amer: >60
Potassium: 4.3
Sodium: 139
Sodium: 136
Total Bilirubin: 0.4

## 2011-05-08 LAB — BASIC METABOLIC PANEL
BUN: 4 — ABNORMAL LOW
BUN: 4 — ABNORMAL LOW
BUN: 6
Calcium: 7.8 — ABNORMAL LOW
Calcium: 7.8 — ABNORMAL LOW
Chloride: 104
Chloride: 104
Chloride: 112
Creatinine, Ser: 0.56
Creatinine, Ser: 0.57
GFR calc Af Amer: >60
GFR calc Af Amer: >60
GFR calc Af Amer: >60
GFR calc non Af Amer: >60
GFR calc non Af Amer: >60
Glucose, Bld: 101 — ABNORMAL HIGH
Glucose, Bld: 158 — ABNORMAL HIGH
Potassium: 3.6
Potassium: 3.8
Potassium: 3.8
Sodium: 136
Sodium: 138

## 2011-05-08 LAB — CULTURE, ROUTINE-ABSCESS

## 2011-05-08 LAB — URINALYSIS, ROUTINE W REFLEX MICROSCOPIC
Bilirubin Urine: NEGATIVE
Hgb urine dipstick: NEGATIVE
Protein, ur: NEGATIVE
Specific Gravity, Urine: 1.015

## 2011-05-08 LAB — URINE CULTURE: Culture: NO GROWTH

## 2011-05-08 LAB — ABO/RH: ABO/RH(D): A POS

## 2011-05-08 LAB — WET PREP, GENITAL
Clue Cells Wet Prep HPF POC: NONE SEEN
Yeast Wet Prep HPF POC: NONE SEEN

## 2011-05-08 LAB — RPR: RPR Ser Ql: NONREACTIVE

## 2011-05-08 LAB — CULTURE, BLOOD (ROUTINE X 2)

## 2011-05-08 LAB — ANAEROBIC CULTURE

## 2011-11-17 ENCOUNTER — Encounter (HOSPITAL_BASED_OUTPATIENT_CLINIC_OR_DEPARTMENT_OTHER): Payer: Self-pay | Admitting: *Deleted

## 2011-11-17 ENCOUNTER — Emergency Department (INDEPENDENT_AMBULATORY_CARE_PROVIDER_SITE_OTHER): Payer: Managed Care, Other (non HMO)

## 2011-11-17 ENCOUNTER — Emergency Department (HOSPITAL_BASED_OUTPATIENT_CLINIC_OR_DEPARTMENT_OTHER)
Admission: EM | Admit: 2011-11-17 | Discharge: 2011-11-17 | Disposition: A | Discharge status: Home / Self Care | Payer: Managed Care, Other (non HMO) | Attending: Emergency Medicine | Admitting: Emergency Medicine

## 2011-11-17 DIAGNOSIS — R05 Cough: Secondary | ICD-10-CM | POA: Insufficient documentation

## 2011-11-17 DIAGNOSIS — R059 Cough, unspecified: Secondary | ICD-10-CM | POA: Insufficient documentation

## 2011-11-17 DIAGNOSIS — R0989 Other specified symptoms and signs involving the circulatory and respiratory systems: Secondary | ICD-10-CM | POA: Insufficient documentation

## 2011-11-17 DIAGNOSIS — J40 Bronchitis, not specified as acute or chronic: Secondary | ICD-10-CM

## 2011-11-17 DIAGNOSIS — R0609 Other forms of dyspnea: Secondary | ICD-10-CM | POA: Insufficient documentation

## 2011-11-17 DIAGNOSIS — J069 Acute upper respiratory infection, unspecified: Secondary | ICD-10-CM

## 2011-11-17 MED ORDER — AEROCHAMBER MAX W/MASK MEDIUM MISC
1.0000 | Freq: Once | Status: AC
  Administered 2011-11-17: 1
  Filled 2011-11-17: qty 1

## 2011-11-17 MED ORDER — ALBUTEROL SULFATE HFA 108 (90 BASE) MCG/ACT IN AERS
2.0000 | INHALATION_SPRAY | Freq: Once | RESPIRATORY_TRACT | Status: AC
  Administered 2011-11-17: 2 via RESPIRATORY_TRACT
  Filled 2011-11-17: qty 6.7

## 2011-11-17 MED ORDER — ALBUTEROL SULFATE HFA 108 (90 BASE) MCG/ACT IN AERS: 1.0000 | INHALATION_SPRAY | Freq: Four times a day (QID) | RESPIRATORY_TRACT | Status: DC | PRN

## 2011-11-17 MED ORDER — PREDNISONE 50 MG PO TABS: 50.0000 mg | ORAL_TABLET | Freq: Every day | ORAL | Status: DC

## 2011-11-17 NOTE — Discharge Instructions (Signed)
Bronchitis Bronchitis is a problem of the air tubes leading to your lungs. This problem makes it hard for air to get in and out of the lungs. You may cough a lot because your air tubes are narrow. Going without care can cause lasting (chronic) bronchitis. HOME CARE   Drink enough fluids to keep your pee (urine) clear or pale yellow.   Use a cool mist humidifier.   Quit smoking if you smoke. If you keep smoking, the bronchitis might not get better.   Only take medicine as told by your doctor.  GET HELP RIGHT AWAY IF:   Coughing keeps you awake.   You start to wheeze.   You become more sick or weak.   You have a hard time breathing or get short of breath.   You cough up blood.   Coughing lasts more than 2 weeks.   You have a fever.   Your baby is older than 3 months with a rectal temperature of 102 F (38.9 C) or higher.   Your baby is 3 months old or younger with a rectal temperature of 100.4 F (38 C) or higher.  MAKE SURE YOU:  Understand these instructions.   Will watch your condition.   Will get help right away if you are not doing well or get worse.  Document Released: 01/09/2008 Document Revised: 07/12/2011 Document Reviewed: 06/24/2009 [employer] Patient Information 2012 [employer]. 

## 2011-11-17 NOTE — ED Notes (Signed)
Has had a cold for the past few days, but last night states that she can't "catch her breath".

## 2011-11-17 NOTE — ED Provider Notes (Signed)
History     CSN: [id number]  Arrival date & time 11/17/11  4098   First MD Initiated Contact with Patient 11/17/11 [phone]      Chief Complaint  Patient presents with  . URI    (Consider location/radiation/quality/duration/timing/severity/associated sxs/prior treatment) Patient is a 41 y.o. female presenting with URI. The history is provided by the patient. No language interpreter was used.  URI The primary symptoms include cough and wheezing. Primary symptoms do not include fever or sore throat. Primary symptoms comment: nasal congestion The current episode started 3 to 5 days ago. This is a new problem. The problem has not changed since onset. The cough began 3 to 5 days ago. The cough is new. The cough is non-productive.  Wheezing began yesterday. Wheezing occurs frequently. The wheezing has been rapidly worsening since its onset. The wheezing had no precipitant. The patient's medical history does not include asthma.  Symptoms associated with the illness include congestion. The following treatments were addressed: A decongestant was ineffective. Risk factors: none.    History reviewed. No pertinent past medical history.  Past Surgical History  Procedure Date  . Cesarean section   . Exploratory laparotomy     No family history on file.  History  Substance Use Topics  . Smoking status: Never Smoker   . Smokeless tobacco: Not on file  . Alcohol Use: No    OB History    Grav Para Term Preterm Abortions TAB SAB Ect Mult Living                  Review of Systems  Constitutional: Negative for fever.  HENT: Positive for congestion. Negative for sore throat.   Eyes: Negative.   Respiratory: Positive for cough and wheezing.   Cardiovascular: Negative for chest pain and leg swelling.  Gastrointestinal: Negative.   Genitourinary: Negative.   Musculoskeletal: Negative.   Skin: Negative.   Neurological: Negative.   Hematological: Negative.   Psychiatric/Behavioral:  Negative.     Allergies  Review of patient's allergies indicates no known allergies.  Home Medications   Current Outpatient Rx  Name Route Sig Dispense Refill  . ALBUTEROL SULFATE HFA 108 (90 BASE) MCG/ACT IN AERS Inhalation Inhale 1-2 puffs into the lungs every 6 (six) hours as needed for wheezing. 1 Inhaler 0  . PREDNISONE 50 MG PO TABS Oral Take 1 tablet (50 mg total) by mouth daily. 5 tablet 0    BP 124/64  Pulse 67  Temp(Src) 97.5 F (36.4 C) (Oral)  Resp 18  SpO2 98%  LMP 11/03/2011  Physical Exam  Constitutional: She is oriented to person, place, and time. She appears well-developed and well-nourished.  HENT:  Head: Normocephalic and atraumatic.  Mouth/Throat: Oropharynx is clear and moist. No oropharyngeal exudate.  Eyes: Conjunctivae are normal. Pupils are equal, round, and reactive to light.  Neck: Normal range of motion. Neck supple.  Cardiovascular: Normal rate and regular rhythm.   Pulmonary/Chest: No respiratory distress. She has decreased breath sounds. She has no rales.  Abdominal: Soft. Bowel sounds are normal. There is no tenderness. There is no rebound and no guarding.  Musculoskeletal: Normal range of motion. She exhibits no edema.  Neurological: She is alert and oriented to person, place, and time.  Skin: Skin is warm and dry.  Psychiatric: She has a normal mood and affect.    ED Course  Procedures (including critical care time)  Labs Reviewed - No data to display Dg Chest 2 View  11/17/2011  *RADIOLOGY  REPORT*  Clinical Data: Dyspnea.  Upper respiratory infection.  CHEST - 2 VIEW  Comparison:  06/04/2008  Findings:  The heart size and mediastinal contours are within normal limits.  Both lungs are clear.  The visualized skeletal structures are unremarkable.  IMPRESSION: No active cardiopulmonary disease.  Original Report Authenticated By: Danae Orleans, M.D.     1. Bronchitis       MDM  PERC negative.  Take all medications and follow up with  your family doctor in 2 days return for worsening symptoms        Ada Woodbury K Paije Goodhart-Rasch, MD 11/17/11 [phone]

## 2012-05-26 ENCOUNTER — Other Ambulatory Visit: Payer: Self-pay | Admitting: Physician Assistant

## 2012-05-26 DIAGNOSIS — Z1231 Encounter for screening mammogram for malignant neoplasm of breast: Secondary | ICD-10-CM

## 2012-05-27 ENCOUNTER — Ambulatory Visit
Admission: RE | Admit: 2012-05-27 | Discharge: 2012-05-27 | Disposition: A | Discharge status: Home / Self Care | Payer: Managed Care, Other (non HMO) | Source: Ambulatory Visit | Attending: Physician Assistant | Admitting: Physician Assistant

## 2012-05-27 DIAGNOSIS — Z1231 Encounter for screening mammogram for malignant neoplasm of breast: Secondary | ICD-10-CM

## 2012-06-20 ENCOUNTER — Encounter (HOSPITAL_COMMUNITY): Payer: Self-pay

## 2012-06-20 ENCOUNTER — Emergency Department (HOSPITAL_COMMUNITY): Payer: Managed Care, Other (non HMO)

## 2012-06-20 ENCOUNTER — Emergency Department (HOSPITAL_COMMUNITY)
Admission: EM | Admit: 2012-06-20 | Discharge: 2012-06-20 | Disposition: A | Discharge status: Home / Self Care | Payer: Managed Care, Other (non HMO) | Attending: Emergency Medicine | Admitting: Emergency Medicine

## 2012-06-20 DIAGNOSIS — M79609 Pain in unspecified limb: Secondary | ICD-10-CM | POA: Insufficient documentation

## 2012-06-20 DIAGNOSIS — R079 Chest pain, unspecified: Secondary | ICD-10-CM | POA: Insufficient documentation

## 2012-06-20 DIAGNOSIS — Z79899 Other long term (current) drug therapy: Secondary | ICD-10-CM | POA: Insufficient documentation

## 2012-06-20 DIAGNOSIS — R0602 Shortness of breath: Secondary | ICD-10-CM | POA: Insufficient documentation

## 2012-06-20 DIAGNOSIS — R42 Dizziness and giddiness: Secondary | ICD-10-CM | POA: Insufficient documentation

## 2012-06-20 DIAGNOSIS — R110 Nausea: Secondary | ICD-10-CM | POA: Insufficient documentation

## 2012-06-20 DIAGNOSIS — R5381 Other malaise: Secondary | ICD-10-CM | POA: Insufficient documentation

## 2012-06-20 LAB — CBC WITH DIFFERENTIAL/PLATELET
Basophils Absolute: 0 10*3/uL (ref 0.0–0.1)
HCT: 36.8 % (ref 36.0–46.0)
Hemoglobin: 12 g/dL (ref 12.0–15.0)
Lymphocytes Relative: 26 % (ref 12–46)
Monocytes Absolute: 0.7 10*3/uL (ref 0.1–1.0)
Monocytes Relative: 6 % (ref 3–12)
Neutro Abs: 7.3 10*3/uL (ref 1.7–7.7)
RDW: 13.8 % (ref 11.5–15.5)
WBC: 11 10*3/uL — ABNORMAL HIGH (ref 4.0–10.5)

## 2012-06-20 LAB — BASIC METABOLIC PANEL
CO2: 26 mEq/L (ref 19–32)
Chloride: 104 mEq/L (ref 96–112)
Creatinine, Ser: 0.55 mg/dL (ref 0.50–1.10)

## 2012-06-20 LAB — POCT I-STAT TROPONIN I: Troponin i, poc: 0 ng/mL (ref 0.00–0.08)

## 2012-06-20 NOTE — ED Notes (Signed)
Pt sts cp started at 1545 today and the pain is radiating down her arm.  Pt sts she is sob

## 2012-06-20 NOTE — ED Provider Notes (Signed)
Medical screening examination/treatment/procedure(s) were conducted as a shared visit with non-physician practitioner(s) and myself.  I personally evaluated the patient during the encounter  Acute onset of L sided chest pain radiating into arm causing "heaviness".  Associated with dizziness and nausea.  Resolved on own after a few minutes. No cardiac history, nonsmoker.  TTP L chest wal.  EKG nonischemic.  Troponin negative.  Patient declines CDU observation.  Glynn Octave, MD 06/20/12 2329

## 2012-06-20 NOTE — ED Provider Notes (Signed)
History     CSN: [id number]  Arrival date & time 06/20/12  1839   First MD Initiated Contact with Patient 06/20/12 2020      Chief Complaint  Patient presents with  . Chest Pain  . Arm Pain    (Consider location/radiation/quality/duration/timing/severity/associated sxs/prior treatment) Patient is a 41 y.o. female presenting with chest pain. The history is provided by the patient.  Chest Pain The chest pain began 3 - 5 hours ago. The chest pain is resolved. At its most intense, the pain is at 7/10. The pain is currently at 1/10. The severity of the pain is moderate. The quality of the pain is described as pressure-like. The pain radiates to the left shoulder and left arm. Primary symptoms include shortness of breath, nausea and dizziness. Pertinent negatives for primary symptoms include no fever, no cough, no wheezing, no palpitations, no abdominal pain and no vomiting.  Dizziness also occurs with nausea and weakness. Dizziness does not occur with vomiting.  Associated symptoms include weakness.   Pt states she was sitting down when symptoms started. States also became dizzy, diaphoretic, nauseated, pain in left arm. States "just didn't feel right." Denies fever, chills. Did not take anything. Symptoms resolved on its own. Denies hx of the same. Otherwise healthy. No medications daily. Family hx of MI in father at age 32. Denies normal exertional chest tightness or sob.   History reviewed. No pertinent past medical history.  Past Surgical History  Procedure Date  . Cesarean section   . Exploratory laparotomy   . Colon surgery     Family History  Problem Relation Age of Onset  . Heart failure Father   . Heart failure Brother     History  Substance Use Topics  . Smoking status: Never Smoker   . Smokeless tobacco: Not on file  . Alcohol Use: Yes    OB History    Grav Para Term Preterm Abortions TAB SAB Ect Mult Living                  Review of Systems    Constitutional: Negative for fever.  HENT: Negative for neck pain and neck stiffness.   Respiratory: Positive for chest tightness and shortness of breath. Negative for cough and wheezing.   Cardiovascular: Positive for chest pain. Negative for palpitations.  Gastrointestinal: Positive for nausea. Negative for vomiting and abdominal pain.  Skin: Negative.   Neurological: Positive for dizziness, weakness and light-headedness.    Allergies  Review of patient's allergies indicates no known allergies.  Home Medications   Current Outpatient Rx  Name  Route  Sig  Dispense  Refill  . ACETAMINOPHEN 500 MG PO TABS   Oral   Take 500 mg by mouth every 6 (six) hours as needed. For pain         . ADULT MULTIVITAMIN W/MINERALS CH   Oral   Take 1 tablet by mouth daily.         Marland Kitchen NORGESTIM-ETH ESTRAD TRIPHASIC 0.18/0.215/0.25 MG-35 MCG PO TABS   Oral   Take 1 tablet by mouth daily.         Marland Kitchen ESTROVEN PO   Oral   Take 1 tablet by mouth daily.           BP 108/72  Temp 98.1 F (36.7 C) (Oral)  Resp 16  SpO2 98%  LMP 05/17/2012  Physical Exam  Nursing note and vitals reviewed. Constitutional: She is oriented to person, place, and time. She appears  well-developed and well-nourished. No distress.  Neck: Neck supple.  Cardiovascular: Normal rate, regular rhythm and normal heart sounds.   Pulmonary/Chest: Effort normal and breath sounds normal. No respiratory distress. She has no wheezes. She has no rales. She exhibits tenderness.       Tender over left upper chest wall  Abdominal: Soft. Bowel sounds are normal. She exhibits no distension. There is no tenderness. There is no rebound.  Musculoskeletal: Normal range of motion. She exhibits no edema.  Neurological: She is alert and oriented to person, place, and time.       5/5 and equal upper extremity strength bilaterally. Equal grip strength bilaterally.  Skin: Skin is warm and dry.  Psychiatric: She has a normal mood and  affect.    ED Course  Procedures (including critical care time)   Date: 06/20/2012  Rate: 79  Rhythm: normal sinus rhythm  QRS Axis: normal  Intervals: normal  ST/T Wave abnormalities: normal  Conduction Disutrbances: none  Narrative Interpretation:   Old EKG Reviewed: No old ecg   Atypical chest pain, now resolved. Will get labs. CXR.   Results for orders placed during the hospital encounter of 06/20/12  CBC WITH DIFFERENTIAL      Component Value Range   WBC 11.0 (*) 4.0 - 10.5 K/uL   RBC 4.38  3.87 - 5.11 MIL/uL   Hemoglobin 12.0  12.0 - 15.0 g/dL   HCT 56.2  13.0 - 86.5 %   MCV 84.0  78.0 - 100.0 fL   MCH 27.4  26.0 - 34.0 pg   MCHC 32.6  30.0 - 36.0 g/dL   RDW 78.4  69.6 - 29.5 %   Platelets 254  150 - 400 K/uL   Neutrophils Relative 67  43 - 77 %   Neutro Abs 7.3  1.7 - 7.7 K/uL   Lymphocytes Relative 26  12 - 46 %   Lymphs Abs 2.9  0.7 - 4.0 K/uL   Monocytes Relative 6  3 - 12 %   Monocytes Absolute 0.7  0.1 - 1.0 K/uL   Eosinophils Relative 1  0 - 5 %   Eosinophils Absolute 0.1  0.0 - 0.7 K/uL   Basophils Relative 0  0 - 1 %   Basophils Absolute 0.0  0.0 - 0.1 K/uL  BASIC METABOLIC PANEL      Component Value Range   Sodium 138  135 - 145 mEq/L   Potassium 4.0  3.5 - 5.1 mEq/L   Chloride 104  96 - 112 mEq/L   CO2 26  19 - 32 mEq/L   Glucose, Bld 86  70 - 99 mg/dL   BUN 7  6 - 23 mg/dL   Creatinine, Ser 2.84  0.50 - 1.10 mg/dL   Calcium 9.1  8.4 - 13.2 mg/dL   GFR calc non Af Amer >90  >90 mL/min   GFR calc Af Amer >90  >90 mL/min  POCT I-STAT TROPONIN I      Component Value Range   Troponin i, poc 0.00  0.00 - 0.08 ng/mL   Comment 3           POCT I-STAT TROPONIN I      Component Value Range   Troponin i, poc 0.00  0.00 - 0.08 ng/mL   Comment 3            Dg Chest 2 View  06/20/2012  *RADIOLOGY REPORT*  Clinical Data: Chest pain.  CHEST - 2 VIEW  Comparison: Chest  x-ray 03/17/2012.  Findings: Lung volumes are normal.  No consolidative airspace  disease.  No pleural effusions.  Pulmonary vasculature is normal. Heart size is mildly enlarged.  Mediastinal contours are unremarkable.  IMPRESSION: 1.  Mild cardiomegaly without radiographic evidence of acute cardiopulmonary disease.   Original Report Authenticated By: Trudie Reed, M.D.    10:19 PM Labs, and two sets of troponin negative.    Filed Vitals:   06/20/12 1843  BP: 108/72  Temp: 98.1 F (36.7 C)  Resp: 16     1. Chest pain       MDM   PT with atypical cp that is now resolved. Two sets of troponin negative. ECG normal. Pt is low risk for acs. Doubt pe, she is not tachycardic, tachypnec, hypoxic, and no current symptoms. Pt appears slightly anxious. We have offered to her to stay in CDU on CP protocol with Cardiac CT in AM, pt refused. At this time i suspect she is OK to be d/c home with close PCP follow up for further evaluation. Pt instructed to return if worsening.          Lottie Mussel, PA 06/20/12 2223

## 2012-09-20 ENCOUNTER — Other Ambulatory Visit: Payer: Self-pay

## 2013-03-10 ENCOUNTER — Emergency Department (INDEPENDENT_AMBULATORY_CARE_PROVIDER_SITE_OTHER)
Admission: EM | Admit: 2013-03-10 | Discharge: 2013-03-10 | Disposition: A | Discharge status: Home / Self Care | Payer: Managed Care, Other (non HMO) | Source: Home / Self Care | Attending: Family Medicine | Admitting: Family Medicine

## 2013-03-10 ENCOUNTER — Encounter: Payer: Self-pay | Admitting: *Deleted

## 2013-03-10 DIAGNOSIS — J069 Acute upper respiratory infection, unspecified: Secondary | ICD-10-CM

## 2013-03-10 DIAGNOSIS — R062 Wheezing: Secondary | ICD-10-CM

## 2013-03-10 MED ORDER — AZITHROMYCIN 250 MG PO TABS: ORAL_TABLET | ORAL | Status: DC

## 2013-03-10 MED ORDER — BENZONATATE 100 MG PO CAPS: 100.0000 mg | ORAL_CAPSULE | Freq: Three times a day (TID) | ORAL | Status: DC | PRN

## 2013-03-10 MED ORDER — METHYLPREDNISOLONE ACETATE 80 MG/ML IJ SUSP
80.0000 mg | Freq: Once | INTRAMUSCULAR | Status: AC
  Administered 2013-03-10: 80 mg via INTRAMUSCULAR

## 2013-03-10 NOTE — ED Provider Notes (Signed)
CSN: [id number]     Arrival date & time 03/10/13  1522 History     First MD Initiated Contact with Patient 03/10/13 1523     Chief Complaint  Patient presents with  . URI    HPI  URI Symptoms Onset: 3 days Description: rhinorrhea, nasal congestion, cough, wheezing, chest tightness Modifying factors:  Mild intermittent asthma, informally diagnosed  Symptoms Nasal discharge: yes fever: no Sore throat: no Cough: yes Wheezing: yes Ear pain: no GI symptoms: no Sick contacts: no  Red Flags  Stiff neck: no Dyspnea: minimal  Rash: no Swallowing difficulty: no  Sinusitis Risk Factors Headache/face pain: no Double sickening: no tooth pain: no  Allergy Risk Factors Sneezing:yes  Itchy scratchy throat: no Seasonal symptoms: yes  Flu Risk Factors Headache: no muscle aches: no severe fatigue: no   History reviewed. No pertinent past medical history. Past Surgical History  Procedure Laterality Date  . Cesarean section    . Exploratory laparotomy    . Colon surgery     Family History  Problem Relation Age of Onset  . Heart failure Father   . Heart attack Father   . Hypertension Father   . Heart failure Brother   . Heart attack Brother   . Dysrhythmia Mother   . Heart attack Sister    History  Substance Use Topics  . Smoking status: Never Smoker   . Smokeless tobacco: Never Used  . Alcohol Use: No   OB History   Grav Para Term Preterm Abortions TAB SAB Ect Mult Living                 Review of Systems  All other systems reviewed and are negative.    Allergies  Review of patient's allergies indicates no known allergies.  Home Medications   Current Outpatient Rx  Name  Route  Sig  Dispense  Refill  . azithromycin (ZITHROMAX) 250 MG tablet      Take 2 tabs PO x 1 dose, then 1 tab PO QD x 4 days   6 tablet   0   . benzonatate (TESSALON) 100 MG capsule   Oral   Take 1-2 capsules (100-200 mg total) by mouth 3 (three) times daily as needed for  cough.   40 capsule   0   . Multiple Vitamin (MULTIVITAMIN WITH MINERALS) TABS   Oral   Take 1 tablet by mouth daily.         . Norgestimate-Ethinyl Estradiol Triphasic (TRI-LINYAH) 0.18/0.215/0.25 MG-35 MCG tablet   Oral   Take 1 tablet by mouth daily.         . Nutritional Supplements (ESTROVEN PO)   Oral   Take 1 tablet by mouth daily.          BP 108/73  Pulse 97  Temp(Src) 98.3 F (36.8 C) (Oral)  Resp 18  Ht 5' 3.5" (1.613 m)  Wt 228 lb (103.42 kg)  BMI 39.75 kg/m2  SpO2 100%  LMP 02/17/2013 Physical Exam  Constitutional: She appears well-developed and well-nourished.  HENT:  Head: Normocephalic and atraumatic.  +nasal erythema, rhinorrhea bilaterally, + post oropharyngeal erythema    Eyes: Conjunctivae are normal. Pupils are equal, round, and reactive to light.  Neck: Normal range of motion.  Cardiovascular: Normal rate and regular rhythm.   Pulmonary/Chest: Effort normal.  Faint wheezes    Abdominal: Soft.  Musculoskeletal: Normal range of motion.  Neurological: She is alert.  Skin: Skin is warm.    ED Course  Procedures (including critical care time)  Labs Reviewed - No data to display No results found. 1. URI (upper respiratory infection)     MDM  Likely viral process Depomedrol 80mg  IM x1 for wheezing Tessalon perles for cough  Discussed infectious and resp red flags.  Zpak if sxs fail to improve in 7-10 days.      The patient and/or caregiver has been counseled thoroughly with regard to treatment plan and/or medications prescribed including dosage, schedule, interactions, rationale for use, and possible side effects and they verbalize understanding. Diagnoses and expected course of recovery discussed and will return if not improved as expected or if the condition worsens. Patient and/or caregiver verbalized understanding.        Doree Albee, MD 03/10/13 [phone]

## 2013-03-10 NOTE — ED Notes (Signed)
Toni Bowman c/o non-productive cough, thick nasal drainage and burning in chest x Sunday night. Denies fever.

## 2013-06-11 ENCOUNTER — Other Ambulatory Visit: Payer: Self-pay

## 2013-12-17 ENCOUNTER — Emergency Department (HOSPITAL_COMMUNITY)
Admission: EM | Admit: 2013-12-17 | Discharge: 2013-12-17 | Disposition: A | Discharge status: Home / Self Care | Payer: Managed Care, Other (non HMO) | Attending: Emergency Medicine | Admitting: Emergency Medicine

## 2013-12-17 ENCOUNTER — Emergency Department (HOSPITAL_COMMUNITY): Payer: Managed Care, Other (non HMO)

## 2013-12-17 ENCOUNTER — Encounter (HOSPITAL_COMMUNITY): Payer: Self-pay | Admitting: Emergency Medicine

## 2013-12-17 DIAGNOSIS — Z792 Long term (current) use of antibiotics: Secondary | ICD-10-CM | POA: Insufficient documentation

## 2013-12-17 DIAGNOSIS — R51 Headache: Secondary | ICD-10-CM | POA: Insufficient documentation

## 2013-12-17 DIAGNOSIS — R079 Chest pain, unspecified: Secondary | ICD-10-CM

## 2013-12-17 DIAGNOSIS — R0789 Other chest pain: Secondary | ICD-10-CM | POA: Insufficient documentation

## 2013-12-17 DIAGNOSIS — Z3202 Encounter for pregnancy test, result negative: Secondary | ICD-10-CM | POA: Insufficient documentation

## 2013-12-17 DIAGNOSIS — N390 Urinary tract infection, site not specified: Secondary | ICD-10-CM | POA: Insufficient documentation

## 2013-12-17 DIAGNOSIS — Z79899 Other long term (current) drug therapy: Secondary | ICD-10-CM | POA: Insufficient documentation

## 2013-12-17 LAB — URINE MICROSCOPIC-ADD ON

## 2013-12-17 LAB — COMPREHENSIVE METABOLIC PANEL
ALBUMIN: 4 g/dL (ref 3.5–5.2)
ALT: 17 U/L (ref 0–35)
AST: 18 U/L (ref 0–37)
Alkaline Phosphatase: 85 U/L (ref 39–117)
BUN: 12 mg/dL (ref 6–23)
CO2: 24 mEq/L (ref 19–32)
CREATININE: 0.54 mg/dL (ref 0.50–1.10)
Calcium: 9.4 mg/dL (ref 8.4–10.5)
Chloride: 101 mEq/L (ref 96–112)
GFR calc non Af Amer: >90 mL/min (ref >90)
Glucose, Bld: 85 mg/dL (ref 70–99)
Potassium: 4.2 mEq/L (ref 3.7–5.3)
Sodium: 139 mEq/L (ref 137–147)
TOTAL PROTEIN: 7.7 g/dL (ref 6.0–8.3)
Total Bilirubin: 0.4 mg/dL (ref 0.3–1.2)

## 2013-12-17 LAB — URINALYSIS, ROUTINE W REFLEX MICROSCOPIC
Bilirubin Urine: NEGATIVE
Glucose, UA: NEGATIVE mg/dL
Ketones, ur: NEGATIVE mg/dL
NITRITE: NEGATIVE
PROTEIN: NEGATIVE mg/dL
SPECIFIC GRAVITY, URINE: 1.008 (ref 1.005–1.030)
Urobilinogen, UA: 1 mg/dL (ref 0.0–1.0)
pH: 6 (ref 5.0–8.0)

## 2013-12-17 LAB — CBC
HEMATOCRIT: 35.7 % — AB (ref 36.0–46.0)
Hemoglobin: 11.5 g/dL — ABNORMAL LOW (ref 12.0–15.0)
MCH: 27.2 pg (ref 26.0–34.0)
MCHC: 32.2 g/dL (ref 30.0–36.0)
MCV: 84.4 fL (ref 78.0–100.0)
Platelets: 239 10*3/uL (ref 150–400)
RBC: 4.23 MIL/uL (ref 3.87–5.11)
RDW: 13.7 % (ref 11.5–15.5)
WBC: 8.4 10*3/uL (ref 4.0–10.5)

## 2013-12-17 LAB — APTT: aPTT: 30 seconds (ref 24–37)

## 2013-12-17 LAB — I-STAT TROPONIN, ED: TROPONIN I, POC: 0 ng/mL (ref 0.00–0.08)

## 2013-12-17 LAB — POC URINE PREG, ED: PREG TEST UR: NEGATIVE

## 2013-12-17 MED ORDER — CEPHALEXIN 500 MG PO CAPS: ORAL_CAPSULE | ORAL | Status: DC

## 2013-12-17 NOTE — ED Notes (Addendum)
The patient patient said she started having chest pain since last night and she took a pepcid ac and a baby aspirin.  She said that did not work so she went to [facility] this afternoon for chest pain and a lingering headache.  [facility] sent her here because of the chest pain.  The patient says it feels like pressure and it radiates to her back.  She rates her pain 3/10.

## 2013-12-17 NOTE — ED Provider Notes (Signed)
CSN: [id number]     Arrival date & time 12/17/13  1757 History   First MD Initiated Contact with Patient 12/17/13 1844     Chief Complaint  Patient presents with  . Chest Pain    Central Chest pain without radiation, also has had a headache for about two months and she cannot get rid of it. She has been to [facility] and it has not resolved.     (Consider location/radiation/quality/duration/timing/severity/associated sxs/prior Treatment) HPI 43 year old female with almost 24 hours of gradual onset of vague chest pressure without radiation or associated symptoms; no history of recent trauma or immobilization; no birth control; no sudden pain no fever no cough no shortness of breath no sharp stabbing pain no exertional pain no pleuritic pain; tried over-the-counter antacids did not help tried baby aspirin which might help; pressure started mildly then became moderate overnight and has been mild all day today with symptoms present for well over 12 hours almost 24 hours now. She also has a gradual onset mild headache for the last 2 months without trauma without fever without stiff neck without change in speech vision swallowing or understanding and without weakness numbness or incoordination or vertigo, the headache is usually better with Tylenol. Her headache is minimal now. She was referred to the emergency room by an urgent care to her chest pain which she has had since last night constantly. History reviewed. No pertinent past medical history. Past Surgical History  Procedure Laterality Date  . Cesarean section    . Exploratory laparotomy    . Colon surgery     Family History  Problem Relation Age of Onset  . Heart failure Father   . Heart attack Father   . Hypertension Father   . Heart failure Brother   . Heart attack Brother   . Dysrhythmia Mother   . Heart attack Sister    History  Substance Use Topics  . Smoking status: Never Smoker   . Smokeless tobacco: Never Used  . Alcohol  Use: No   OB History   Grav Para Term Preterm Abortions TAB SAB Ect Mult Living                 Review of Systems 10 Systems reviewed and are negative for acute change except as noted in the HPI.   Allergies  Review of patient's allergies indicates no known allergies.  Home Medications   Prior to Admission medications   Medication Sig Start Date End Date Taking? Authorizing Provider  azithromycin (ZITHROMAX) 250 MG tablet Take 2 tabs PO x 1 dose, then 1 tab PO QD x 4 days 03/10/13   Doree AlbeeSteven Newton, MD  benzonatate (TESSALON) 100 MG capsule Take 1-2 capsules (100-200 mg total) by mouth 3 (three) times daily as needed for cough. 03/10/13   Doree AlbeeSteven Newton, MD  Multiple Vitamin (MULTIVITAMIN WITH MINERALS) TABS Take 1 tablet by mouth daily.    Historical Provider, MD  Norgestimate-Ethinyl Estradiol Triphasic (TRI-LINYAH) 0.18/0.215/0.25 MG-35 MCG tablet Take 1 tablet by mouth daily.    Historical Provider, MD  Nutritional Supplements (ESTROVEN PO) Take 1 tablet by mouth daily.    Historical Provider, MD   BP 110/52  Pulse 70  Temp(Src) 98 F (36.7 C) (Oral)  Resp 19  Ht 5\' 3"  (1.6 m)  Wt 240 lb (108.863 kg)  BMI 42.52 kg/m2  SpO2 98%  LMP 12/14/2013 Physical Exam  Nursing note and vitals reviewed. Constitutional:  Awake, alert, nontoxic appearance with baseline speech for patient.  HENT:  Head: Atraumatic.  Mouth/Throat: No oropharyngeal exudate.  Eyes: EOM are normal. Pupils are equal, round, and reactive to light. Right eye exhibits no discharge. Left eye exhibits no discharge.  Neck: Neck supple.  Cardiovascular: Normal rate and regular rhythm.   No murmur heard. Pulmonary/Chest: Effort normal and breath sounds normal. No stridor. No respiratory distress. She has no wheezes. She has no rales. She exhibits no tenderness.  Abdominal: Soft. Bowel sounds are normal. She exhibits no mass. There is no tenderness. There is no rebound.  Musculoskeletal: She exhibits no edema and no  tenderness.  Baseline ROM, moves extremities with no obvious new focal weakness.  Lymphadenopathy:    She has no cervical adenopathy.  Neurological:  Awake, alert, cooperative and aware of situation; motor strength bilaterally; sensation normal to light touch bilaterally; peripheral visual fields full to confrontation; no facial asymmetry; tongue midline; major cranial nerves appear intact; no pronator drift, normal finger to nose bilaterally, baseline gait without new ataxia.  Skin: No rash noted.  Psychiatric: She has a normal mood and affect.    ED Course  Procedures (including critical care time) PERC negative HEART<3 Patient informed of clinical course, understand medical decision-making process, and agree with plan. Labs Review Labs Reviewed  CBC - Abnormal; Notable for the following:    Hemoglobin 11.5 (*)    HCT 35.7 (*)    All other components within normal limits  URINALYSIS, ROUTINE W REFLEX MICROSCOPIC - Abnormal; Notable for the following:    Hgb urine dipstick SMALL (*)    Leukocytes, UA MODERATE (*)    All other components within normal limits  URINE MICROSCOPIC-ADD ON - Abnormal; Notable for the following:    Squamous Epithelial / LPF FEW (*)    Bacteria, UA FEW (*)    All other components within normal limits  APTT  COMPREHENSIVE METABOLIC PANEL  I-STAT TROPOININ, ED  POC URINE PREG, ED  Rosezena SensorI-STAT TROPOININ, ED    Imaging Review Dg Chest 2 View  12/17/2013   CLINICAL DATA:  Mid chest pain.  Two month history of headache.  EXAM: CHEST  2 VIEW  COMPARISON:  Two-view chest x-ray 06/20/2012, 03/17/2012, 02/12/2012, 11/17/2011 and one view chest x-ray 06/04/2008.  FINDINGS: Cardiomediastinal silhouette unremarkable and unchanged. Lungs clear. Bronchovascular markings normal. Pulmonary vascularity normal. No visible pleural effusions. No pneumothorax. Degenerative changes involving the thoracic spine. No significant interval change.  IMPRESSION: No acute cardiopulmonary  disease.  Stable examination.   Electronically Signed   By: Hulan Saashomas  Lawrence M.D.   On: 12/17/2013 19:13     EKG Interpretation   Date/Time:  Thursday Dec 17 2013 18:00:59 EDT Ventricular Rate:  79 PR Interval:  132 QRS Duration: 80 QT Interval:  390 QTC Calculation: 447 R Axis:   56 Text Interpretation:  Normal sinus rhythm Normal ECG No significant change  since last tracing Confirmed by [facility]  MD, Jonny RuizJOHN ([id number]) on 12/17/2013  6:47:06 PM      MDM   Final diagnoses:  Chest pain  UTI (lower urinary tract infection)    I doubt any other EMC precluding discharge at this time including, but not necessarily limited to the following:SAH, CVA, SBI, AMI, PE.    Hurman HornJohn M Shantel Helwig, MD 12/18/13 [phone]

## 2013-12-17 NOTE — ED Notes (Signed)
Pt resting quietly at this time.  Monitor NSR, vitals stable.

## 2013-12-17 NOTE — Discharge Instructions (Signed)

## 2014-01-25 ENCOUNTER — Encounter: Payer: Self-pay | Admitting: Internal Medicine

## 2014-01-25 ENCOUNTER — Ambulatory Visit (INDEPENDENT_AMBULATORY_CARE_PROVIDER_SITE_OTHER): Payer: Managed Care, Other (non HMO) | Admitting: Internal Medicine

## 2014-01-25 VITALS — BP 128/84 | Ht 63.0 in | Wt 242.4 lb

## 2014-01-25 DIAGNOSIS — R0602 Shortness of breath: Secondary | ICD-10-CM

## 2014-01-25 DIAGNOSIS — Z13220 Encounter for screening for lipoid disorders: Secondary | ICD-10-CM

## 2014-01-25 DIAGNOSIS — R0989 Other specified symptoms and signs involving the circulatory and respiratory systems: Secondary | ICD-10-CM

## 2014-01-25 DIAGNOSIS — G44221 Chronic tension-type headache, intractable: Secondary | ICD-10-CM

## 2014-01-25 DIAGNOSIS — R0789 Other chest pain: Secondary | ICD-10-CM

## 2014-01-25 DIAGNOSIS — G44229 Chronic tension-type headache, not intractable: Secondary | ICD-10-CM

## 2014-01-25 DIAGNOSIS — R079 Chest pain, unspecified: Secondary | ICD-10-CM

## 2014-01-25 DIAGNOSIS — R0683 Snoring: Secondary | ICD-10-CM

## 2014-01-25 DIAGNOSIS — R0609 Other forms of dyspnea: Secondary | ICD-10-CM

## 2014-01-25 LAB — LIPID PANEL
Cholesterol: 180 mg/dL (ref 0–200)
HDL: 49 mg/dL (ref >39)
LDL Cholesterol: 115 mg/dL — ABNORMAL HIGH (ref 0–99)
Total CHOL/HDL Ratio: 3.7 Ratio
Triglycerides: 82 mg/dL (ref <150)
VLDL: 16 mg/dL (ref 0–40)

## 2014-01-25 NOTE — Patient Instructions (Signed)
Your physician has recommended that you have a sleep study. This test records several body functions during sleep, including: brain activity, eye movement, oxygen and carbon dioxide blood levels, heart rate and rhythm, breathing rate and rhythm, the flow of air through your mouth and nose, snoring, body muscle movements, and chest and belly movement. ** [facility]  Your physician recommends that you return for lab work in: TODAY  Your physician has requested that you have an exercise tolerance test. For further information please visit [email or web address] Please also follow instruction sheet, as given.  Your physician recommends that you schedule a follow-up appointment in: 1 month with Dr. Rennis GoldenHilty - after your tests.

## 2014-01-27 ENCOUNTER — Encounter (HOSPITAL_COMMUNITY): Payer: Managed Care, Other (non HMO)

## 2014-02-01 ENCOUNTER — Telehealth: Payer: Self-pay | Admitting: Internal Medicine

## 2014-02-01 NOTE — Telephone Encounter (Signed)
LEFT MESSAGE ON PATIENT'S ANSWER MACHINE TO CALL BACK

## 2014-02-01 NOTE — Telephone Encounter (Signed)
Calling about lab results .Marland Kitchen. Please call    Thanks

## 2014-02-01 NOTE — Telephone Encounter (Signed)
Preliminary Results -given lipid results. Patient is aware Dr Rennis GoldenHilty has not reviewed labs.

## 2014-02-04 ENCOUNTER — Telehealth (HOSPITAL_COMMUNITY): Payer: Self-pay

## 2014-02-04 ENCOUNTER — Encounter: Payer: Self-pay | Admitting: Internal Medicine

## 2014-02-04 DIAGNOSIS — R519 Headache, unspecified: Secondary | ICD-10-CM | POA: Insufficient documentation

## 2014-02-04 DIAGNOSIS — R51 Headache: Secondary | ICD-10-CM

## 2014-02-04 DIAGNOSIS — R079 Chest pain, unspecified: Secondary | ICD-10-CM | POA: Insufficient documentation

## 2014-02-04 NOTE — Progress Notes (Signed)
OFFICE NOTE  Chief Complaint:  Chest pain  Primary Care Physician: Lovenia KimHEPLER,MARK, PA-C  HPI:  Martha ClanJodie F Bowman is a pleasant 43 year old female who is coming referred to me for evaluation of chest pain. She's recently been suffering from persistent headaches and chest pain. [facility] and ruled out for MI. She was referred for outpatient cardiac workup. She has few risk factors for heart disease however does have a family history of heart disease. She does describe chest pain which is crushing in the left chest and radiates to the left upper back. It feels like someone is "pushing their fist through my chest" into her back. This is sometimes associated with headaches but all other times not. She does report poor sleep at night and at times has headaches in the morning. She reports some fatigue during the day as well. She knows that she snores but has not been told that she stops breathing at night. This is of course concerning for sleep apnea.  She's also concerned based on her family history of her father who died of an MI at age 42, and a brother who had heart bypass surgery at age 42. Apparently her mother died of a heart attack as well.  PMHx:  Past Medical History  Diagnosis Date  . Frequent headaches     Past Surgical History  Procedure Laterality Date  . Cesarean section    . Exploratory laparotomy    . Colon surgery      FAMHx:  Family History  Problem Relation Age of Onset  . Heart failure Father     HTN  . Heart attack Father   . Hypertension Father   . Heart failure Brother     HTN, high cholesterol   . Heart attack Brother     CABGx4  . Dysrhythmia Mother     AF, HTN, MI  . Heart attack Sister     dysrhythmia, HTN, hyperlipidemia  . Alzheimer's disease Paternal Grandmother   . Hyperlipidemia Sister     SOCHx:   reports that she has never smoked. She has never used smokeless tobacco. She reports that she does not drink alcohol or use  illicit drugs.  ALLERGIES:  No Known Allergies  ROS: A comprehensive review of systems was negative except for: Cardiovascular: positive for chest pain Neurological: positive for headaches  HOME MEDS: Current Outpatient Prescriptions  Medication Sig Dispense Refill  . ALPRAZolam (XANAX) 0.5 MG tablet Take 0.5 mg by mouth at bedtime as needed for anxiety.      Marland Kitchen. aspirin 81 MG tablet Take 81 mg by mouth daily.      . cetirizine (ZYRTEC) 10 MG tablet Take 10 mg by mouth daily.      . Multiple Vitamin (MULTIVITAMIN WITH MINERALS) TABS Take 1 tablet by mouth daily.      . sertraline (ZOLOFT) 50 MG tablet Take 50 mg by mouth daily.       No current facility-administered medications for this visit.    LABS/IMAGING: No results found for this or any previous visit (from the past 48 hour(s)). No results found.  VITALS: BP 128/84  Ht 5\' 3"  (1.6 m)  Wt 242 lb 6.4 oz (109.952 kg)  BMI 42.95 kg/m2  EXAM: General appearance: alert and no distress Neck: no carotid bruit and no JVD Lungs: clear to auscultation bilaterally Heart: regular rate and rhythm, S1, S2 normal, no murmur, click, rub or gallop Abdomen: soft, non-tender; bowel sounds normal;  no masses,  no organomegaly Extremities: extremities normal, atraumatic, no cyanosis or edema Pulses: 2+ and symmetric Skin: Skin color, texture, turgor normal. No rashes or lesions Neurologic: Grossly normal Psych: Mood, affect normal  EKG: Deferred due to recent hospital EKG which was normal  ASSESSMENT: 1. Atypical chest pain 2. Headaches 3. Fatigue, nonrestorative sleep, snoring  PLAN: 1.   Toni Bowman is having frequent headaches with somewhat atypical chest pain. Just reports some fatigue and nonrestorative sleep as well as snoring. I wonder she may have sleep apnea. I would recommend a sleep study. In addition, I would recommend a Bruce treadmill stress test to further evaluate her chest pain however it does seem atypical. This is  mostly due to her family history of premature coronary disease.  Plan to see her back in a month to discuss the results of her tests. Thank you again for the kind referral.  Chrystie NoseKenneth C. Curtistine Pettitt, MD, [facility] Attending Cardiologist [facility]  Kahlee Metivier C 02/04/2014, 5:32 PM

## 2014-02-04 NOTE — Telephone Encounter (Signed)
Awaiting return call

## 2014-02-09 ENCOUNTER — Telehealth: Payer: Self-pay | Admitting: *Deleted

## 2014-02-09 NOTE — Telephone Encounter (Signed)
Faxed OV note from 6/22 to [employer] - for sleep study

## 2014-02-10 ENCOUNTER — Telehealth: Payer: Self-pay | Admitting: *Deleted

## 2014-02-10 ENCOUNTER — Ambulatory Visit (HOSPITAL_COMMUNITY)
Admission: RE | Admit: 2014-02-10 | Discharge: 2014-02-10 | Disposition: A | Discharge status: Home / Self Care | Payer: Managed Care, Other (non HMO) | Source: Ambulatory Visit | Attending: Cardiology | Admitting: Cardiology

## 2014-02-10 DIAGNOSIS — R0602 Shortness of breath: Secondary | ICD-10-CM | POA: Insufficient documentation

## 2014-02-10 DIAGNOSIS — R079 Chest pain, unspecified: Secondary | ICD-10-CM | POA: Insufficient documentation

## 2014-02-10 NOTE — Procedures (Signed)
Exercise Treadmill Test    Test  Exercise Tolerance Test Ordering MD: Chrystie NoseKenneth C. Hilty, MD  Interpreting MD  Unique Test No:1 Treadmill:  1  Indication for ETT: chest pain - rule out ischemia  Contraindication to ETT: Yes   Stress Modality: exercise - treadmill  Cardiac Imaging Performed: non   Protocol: standard Bruce - maximal  Max BP:  175/107  Max MPHR (bpm): 179 85% MPR (bpm): 152  MPHR obtained (bpm): 157 % MPHR obtained: 87  Reached 85% MPHR (min:sec):  5:25 Total Exercise Time (min-sec): 6:01  Workload in METS:  7.00 Borg Scale:   Reason ETT Terminated:  chest tightness(scale 1to10-7or8)    ST Segment Analysis At Rest: Sinus tachycardia; no ST changes With Exercise: no evidence of significant ST depression  Other Information Arrhythmia:  No Angina during ETT:  test stopped due to chest pain Quality of ETT:  diagnostic  ETT Interpretation:  normal - no evidence of ischemia by ST analysis  Comments: ETT with fair exercise tolerance; hypertensive response; study terminated due to patient complaint of chest pain; no ST changes. Olga MillersBrian Maykayla Highley

## 2014-02-10 NOTE — Telephone Encounter (Signed)
Pt here for nuclear study requesting if she would be okay to take the TRU heart tablets. Discussed with sally earl pharm md, pt given the okay to take the medication. Patient voiced understanding

## 2014-02-12 ENCOUNTER — Telehealth: Payer: Self-pay | Admitting: Internal Medicine

## 2014-02-12 NOTE — Telephone Encounter (Signed)
Would like to speak to someone about symptoms that she is having . Chest pains , abdominal pains and back pains . Also having to go to the bathroom really quick after eating. Please call    Thanks

## 2014-02-12 NOTE — Telephone Encounter (Signed)
RN returned call to patient. She reports she has had a new onset of abdominal pain that radiates to shoulder and back on right side. Sometimes the abdominal pain is so bad it wakes her up at night and she feels like the will throw up . She reports she is having to go to the bathroom 3-4 times after every meal and went 5 times after a cookout at work today. She reports today she noticed a clammy sweat. She denies fever.   Patient had normal stress test.   Patient was unsure if symptoms seemed cardiac or an abdominal organ issue, and RN informed her that it appears given her symptoms and normal stress test she may have a gallbladder issue, which patient states she was thinking may be the problem. She states she took off work on Monday to get evaluated by PCP. RN advised her if she should start to develop a fever or symptoms worsen to go to urgent care for eval.   RN looked up cholecystitis on [email or web address] and read off common symptoms and patient reports that sounds just like her issues.   Will defer to Dr. Rennis GoldenHilty as FYI/to advise as necessary.

## 2014-02-13 NOTE — Telephone Encounter (Signed)
Sounds GI  .Marland Kitchen. Would defer to that specialist or PCP.  Dr. HRexene Edison

## 2014-02-15 NOTE — Telephone Encounter (Signed)
Called patient to check in with her. She has appmt with PCP 7/14  She complains of headaches and chest pressure/tightness only when having GI issues, which have not improced.   She is concerned that while her stress test was normal, she was hypertensive.   RN advised her to keep appmt with Dr. Rennis GoldenHilty and update us on PCP appmt r/t GI issues.   Informed patient Dr. Rennis GoldenHilty would be notified as FYI.

## 2014-02-24 ENCOUNTER — Encounter: Payer: Self-pay | Admitting: Internal Medicine

## 2014-02-24 ENCOUNTER — Ambulatory Visit (INDEPENDENT_AMBULATORY_CARE_PROVIDER_SITE_OTHER): Payer: Managed Care, Other (non HMO) | Admitting: Internal Medicine

## 2014-02-24 VITALS — BP 120/63 | HR 80 | Ht 63.0 in | Wt 247.0 lb

## 2014-02-24 DIAGNOSIS — R0789 Other chest pain: Secondary | ICD-10-CM

## 2014-02-24 NOTE — Progress Notes (Signed)
OFFICE NOTE  Chief Complaint:  Chest pain  Primary Care Physician: Lovenia KimHEPLER,MARK, PA-C  HPI:  Martha ClanJodie F Bowman is a pleasant 43 year old female who is coming referred to me for evaluation of chest pain. She's recently been suffering from persistent headaches and chest pain. [facility] and ruled out for MI. She was referred for outpatient cardiac workup. She has few risk factors for heart disease however does have a family history of heart disease. She does describe chest pain which is crushing in the left chest and radiates to the left upper back. It feels like someone is "pushing their fist through my chest" into her back. This is sometimes associated with headaches but all other times not. She does report poor sleep at night and at times has headaches in the morning. She reports some fatigue during the day as well. She knows that she snores but has not been told that she stops breathing at night. This is of course concerning for sleep apnea.  She's also concerned based on her family history of her father who died of an MI at age 42, and a brother who had heart bypass surgery at age 42. Apparently her mother died of a heart attack as well.  Mrs. Orvan FalconerCampbell returns for followup of her stress test. This was negative for ischemia. She denies any more chest pain symptoms. She still getting some tingling in her arm. She seems to be more bothered by GI symptoms and recently underwent an abdominal ultrasound however those results are not yet available.  PMHx:  Past Medical History  Diagnosis Date  . Frequent headaches     Past Surgical History  Procedure Laterality Date  . Cesarean section    . Exploratory laparotomy    . Colon surgery      FAMHx:  Family History  Problem Relation Age of Onset  . Heart failure Father     HTN  . Heart attack Father   . Hypertension Father   . Heart failure Brother     HTN, high cholesterol   . Heart attack Brother     CABGx4  .  Dysrhythmia Mother     AF, HTN, MI  . Heart attack Sister     dysrhythmia, HTN, hyperlipidemia  . Alzheimer's disease Paternal Grandmother   . Hyperlipidemia Sister     SOCHx:   reports that she has never smoked. She has never used smokeless tobacco. She reports that she does not drink alcohol or use illicit drugs.  ALLERGIES:  No Known Allergies  ROS: A comprehensive review of systems was negative except for: Cardiovascular: positive for chest pain Gastrointestinal: positive for abdominal pain and reflux symptoms Neurological: positive for headaches  HOME MEDS: Current Outpatient Prescriptions  Medication Sig Dispense Refill  . ALPRAZolam (XANAX) 0.5 MG tablet Take 0.5 mg by mouth at bedtime as needed for anxiety.      Marland Kitchen. aspirin 325 MG tablet Take 325 mg by mouth daily as needed.      Marland Kitchen. aspirin 81 MG tablet Take 81 mg by mouth as needed.       . cetirizine (ZYRTEC) 10 MG tablet Take 10 mg by mouth daily.      . Multiple Vitamin (MULTIVITAMIN WITH MINERALS) TABS Take 1 tablet by mouth daily.       No current facility-administered medications for this visit.    LABS/IMAGING: No results found for this or any previous visit (from the past 48 hour(s)). No results  found.  VITALS: BP 120/63  Pulse 80  Ht 5\' 3"  (1.6 m)  Wt 247 lb (112.038 kg)  BMI 43.76 kg/m2  EXAM: deferred  EKG: Deferred  ASSESSMENT: 1. Atypical chest pain - negative stress test 2. Headaches 3. Fatigue, nonrestorative sleep, snoring  PLAN: 1.   Ms. Liz had a negative stress test for ischemia. She had a recent lipid profile which shows a total cholesterol 180 and LDL just over 110. This is reasonable control given her lack of risk factors other than a family history.  I would recommend working on diet and exercise and do not see an indication for statin medication at this time. I would continue to pursue GI workup based on her symptoms.    She can followup with me as needed.  Chrystie Nose,  MD, [facility] Attending Cardiologist [facility]  Karmela Bram C 02/24/2014, 6:12 PM

## 2014-02-24 NOTE — Patient Instructions (Signed)
Your physician recommends that you schedule a follow-up appointment as needed  

## 2014-03-26 ENCOUNTER — Encounter (HOSPITAL_BASED_OUTPATIENT_CLINIC_OR_DEPARTMENT_OTHER): Payer: Managed Care, Other (non HMO)

## 2014-09-29 ENCOUNTER — Other Ambulatory Visit: Payer: Self-pay

## 2014-09-29 DIAGNOSIS — Z139 Encounter for screening, unspecified: Secondary | ICD-10-CM

## 2014-10-06 ENCOUNTER — Ambulatory Visit (INDEPENDENT_AMBULATORY_CARE_PROVIDER_SITE_OTHER): Payer: Managed Care, Other (non HMO) | Admitting: Certified Nurse Midwife

## 2014-10-06 ENCOUNTER — Encounter: Payer: Self-pay | Admitting: Certified Nurse Midwife

## 2014-10-06 VITALS — BP 110/62 | HR 74 | Resp 20 | Ht 63.75 in | Wt 243.0 lb

## 2014-10-06 DIAGNOSIS — N946 Dysmenorrhea, unspecified: Secondary | ICD-10-CM | POA: Diagnosis not present

## 2014-10-06 DIAGNOSIS — N920 Excessive and frequent menstruation with regular cycle: Secondary | ICD-10-CM

## 2014-10-06 DIAGNOSIS — N940 Mittelschmerz: Secondary | ICD-10-CM

## 2014-10-06 DIAGNOSIS — Z124 Encounter for screening for malignant neoplasm of cervix: Secondary | ICD-10-CM

## 2014-10-06 DIAGNOSIS — Z01419 Encounter for gynecological examination (general) (routine) without abnormal findings: Secondary | ICD-10-CM

## 2014-10-06 NOTE — Patient Instructions (Signed)
EXERCISE AND DIET:  We recommended that you start or continue a regular exercise program for good health. Regular exercise means any activity that makes your heart beat faster and makes you sweat.  We recommend exercising at least 30 minutes per day at least 3 days a week, preferably 4 or 5.  We also recommend a diet low in fat and sugar.  Inactivity, poor dietary choices and obesity can cause diabetes, heart attack, stroke, and kidney damage, among others.    ALCOHOL AND SMOKING:  Women should limit their alcohol intake to no more than 7 drinks/beers/glasses of wine (combined, not each!) per week. Moderation of alcohol intake to this level decreases your risk of breast cancer and liver damage. And of course, no recreational drugs are part of a healthy lifestyle.  And absolutely no smoking or even second hand smoke. Most people know smoking can cause heart and lung diseases, but did you know it also contributes to weakening of your bones? Aging of your skin?  Yellowing of your teeth and nails?  CALCIUM AND VITAMIN D:  Adequate intake of calcium and Vitamin D are recommended.  The recommendations for exact amounts of these supplements seem to change often, but generally speaking 600 mg of calcium (either carbonate or citrate) and 800 units of Vitamin D per day seems prudent. Certain women may benefit from higher intake of Vitamin D.  If you are among these women, your doctor will have told you during your visit.    PAP SMEARS:  Pap smears, to check for cervical cancer or precancers,  have traditionally been done yearly, although recent scientific advances have shown that most women can have pap smears less often.  However, every woman still should have a physical exam from her gynecologist every year. It will include a breast check, inspection of the vulva and vagina to check for abnormal growths or skin changes, a visual exam of the cervix, and then an exam to evaluate the size and shape of the uterus and  ovaries.  And after 43 years of age, a rectal exam is indicated to check for rectal cancers. We will also provide age appropriate advice regarding health maintenance, like when you should have certain vaccines, screening for sexually transmitted diseases, bone density testing, colonoscopy, mammograms, etc.   MAMMOGRAMS:  All women over 40 years old should have a yearly mammogram. Many facilities now offer a "3D" mammogram, which may cost around $50 extra out of pocket. If possible,  we recommend you accept the option to have the 3D mammogram performed.  It both reduces the number of women who will be called back for extra views which then turn out to be normal, and it is better than the routine mammogram at detecting truly abnormal areas.    COLONOSCOPY:  Colonoscopy to screen for colon cancer is recommended for all women at age 50.  We know, you hate the idea of the prep.  We agree, BUT, having colon cancer and not knowing it is worse!!  Colon cancer so often starts as a polyp that can be seen and removed at colonscopy, which can quite literally save your life!  And if your first colonoscopy is normal and you have no family history of colon cancer, most women don't have to have it again for 10 years.  Once every ten years, you can do something that may end up saving your life, right?  We will be happy to help you get it scheduled when you are ready.    Be sure to check your insurance coverage so you understand how much it will cost.  It may be covered as a preventative service at no cost, but you should check your particular policy.     Exercise to Lose Weight Exercise and a healthy diet may help you lose weight. Your doctor may suggest specific exercises. EXERCISE IDEAS AND TIPS  Choose low-cost things you enjoy doing, such as walking, bicycling, or exercising to workout videos.  Take stairs instead of the elevator.  Walk during your lunch break.  Park your car further away from work or  school.  Go to a gym or an exercise class.  Start with 5 to 10 minutes of exercise each day. Build up to 30 minutes of exercise 4 to 6 days a week.  Wear shoes with good support and comfortable clothes.  Stretch before and after working out.  Work out until you breathe harder and your heart beats faster.  Drink extra water when you exercise.  Do not do so much that you hurt yourself, feel dizzy, or get very short of breath. Exercises that burn about 150 calories:  Running 1  miles in 15 minutes.  Playing volleyball for 45 to 60 minutes.  Washing and waxing a car for 45 to 60 minutes.  Playing touch football for 45 minutes.  Walking 1  miles in 35 minutes.  Pushing a stroller 1  miles in 30 minutes.  Playing basketball for 30 minutes.  Raking leaves for 30 minutes.  Bicycling 5 miles in 30 minutes.  Walking 2 miles in 30 minutes.  Dancing for 30 minutes.  Shoveling snow for 15 minutes.  Swimming laps for 20 minutes.  Walking up stairs for 15 minutes.  Bicycling 4 miles in 15 minutes.  Gardening for 30 to 45 minutes.  Jumping rope for 15 minutes.  Washing windows or floors for 45 to 60 minutes. Document Released: 08/25/2010 Document Revised: 10/15/2011 Document Reviewed: 08/25/2010 Exit[employer] Patient Information 2015 [employer]. This information is not intended to replace advice given to you by your health care provider. Make sure you discuss any questions you have with your health care provider.  

## 2014-10-06 NOTE — Progress Notes (Signed)
44 y.o. [id number] Married  Caucasian Fe here to establish gyn care and  for annual exam. Periods are 28-32 days with past 3 cycles with 2 days of heavy cycles requiring tampon and pad use.,with cramping, uses OTC Motrin with relief. . Sees Dr. Tommi EmeryHepler yearly, for aex, labs, medication for anxiety . Does SBE and mammogram scheduled. Patient complaining of discomfort with ovulation, that has occurred in past few months which does subside. Uses OTC Motrin with relief.. Concerned. No other health issues today.  Patient's last menstrual period was 09/20/2014.          Sexually active: Yes.    The current method of family planning is condoms all the time.    Exercising: Yes.    walking Smoker:  no  Health Maintenance: Pap:  2013 neg per patient MMG:  2014, scheduled for next week Colonoscopy:  2009 normal BMD:   none TDaP:  Within past 11059yrs Labs: none Self breast exam: done occ   reports that she has never smoked. She has never used smokeless tobacco. She reports that she drinks about 1.8 oz of alcohol per week. She reports that she does not use illicit drugs.  Past Medical History  Diagnosis Date  . Frequent headaches   . Migraines   . Pleurisy   . Anxiety   . Dyspareunia   . PCOS (polycystic ovarian syndrome)   . Infertility, female     Past Surgical History  Procedure Laterality Date  . Cesarean section    . Exploratory laparotomy    . Colon surgery      Current Outpatient Prescriptions  Medication Sig Dispense Refill  . ALPRAZolam (XANAX) 0.5 MG tablet Take 0.5 mg by mouth at bedtime as needed for anxiety.    . cetirizine (ZYRTEC) 10 MG tablet Take 10 mg by mouth daily.    Marland Kitchen. FLUoxetine (PROZAC) 10 MG capsule Take 10 mg by mouth every morning.  1  . Multiple Vitamin (MULTIVITAMIN WITH MINERALS) TABS Take 1 tablet by mouth daily.    . SUMAtriptan (IMITREX) 100 MG tablet as needed.   0   No current facility-administered medications for this visit.    Family History  Problem  Relation Age of Onset  . Heart failure Father     HTN  . Heart attack Father   . Hypertension Father   . Heart failure Brother     HTN, high cholesterol   . Heart attack Brother     CABGx4  . Dysrhythmia Mother     AF, HTN, MI  . Heart attack Sister     dysrhythmia, HTN, hyperlipidemia  . Alzheimer's disease Paternal Grandmother   . Hyperlipidemia Sister     ROS:  Pertinent items are noted in HPI.  Otherwise, a comprehensive ROS was negative.  Exam:   BP 110/62 mmHg  Pulse 74  Resp 20  Ht 5' 3.75" (1.619 m)  Wt 243 lb (110.224 kg)  BMI 42.05 kg/m2  LMP 09/20/2014 Height: 5' 3.75" (161.9 cm) Ht Readings from Last 3 Encounters:  10/06/14 5' 3.75" (1.619 m)  02/24/14 5\' 3"  (1.6 m)  01/25/14 5\' 3"  (1.6 m)    General appearance: alert, cooperative and appears stated age Head: Normocephalic, without obvious abnormality, atraumatic Neck: no adenopathy, supple, symmetrical, trachea midline and thyroid normal to inspection and palpation Lungs: clear to auscultation bilaterally Breasts: normal appearance, no masses or tenderness, No nipple retraction or dimpling, No nipple discharge or bleeding, No axillary or supraclavicular adenopathy Heart: regular rate  and rhythm Abdomen: soft, non-tender; no masses,  no organomegaly Extremities: extremities normal, atraumatic, no cyanosis or edema Skin: Skin color, texture, turgor normal. No rashes or lesions Lymph nodes: Cervical, supraclavicular, and axillary nodes normal. No abnormal inguinal nodes palpated Neurologic: Grossly normal   Pelvic: External genitalia:  no lesions              Urethra:  normal appearing urethra with no masses, tenderness or lesions              Bartholin's and Skene's: normal                 Vagina: normal appearing vagina with normal color and discharge, no lesions              Cervix: normal, non tender, no lesions              Pap taken: Yes.   Bimanual Exam:  Uterus:  hard to palpate, due to body  habitus, no large masses              Adnexa: no mass, fullness, tenderness and hard to palpate due to body habitus, adnexal not palpated.               Rectovaginal: Confirms               Anus:  normal sphincter tone, no lesions  Chaperone present: Yes  A:  Well Woman with normal exam with limited pelvic exam due Morbid Obesity  Contraception consistent condoms  Morbid obesity  Ovulation pain/dysmenorrhea  Menorrhagia  History of PCOS per patient  Strong history of hear disease and hypertension  Anxiety/ Migraines no aura with PCP management  P:   Reviewed health and wellness pertinent to exam  Discussed limitations of pelvic exam due to obesity and need for PUS to evaluate in addition with problem with Menorrhagia and pain also recommended. Patient agreeable. Patient will be called with insurance information and scheduled.  Patient given warnings of excessive bleeding and discussed cycle control options once PUS done. Patient happy with condoms, but would consider other options to control cycle changes if needed.  Pap smear taken today with  HPVHR   counseled on breast self exam, mammography screening, adequate intake of calcium and vitamin D, diet and exercise to reduce risk of other health issues. Patient is trying to work on diet intake.  return annually or prn  An After Visit Summary was printed and given to the patient.

## 2014-10-08 LAB — IPS PAP TEST WITH HPV

## 2014-10-14 ENCOUNTER — Telehealth: Payer: Self-pay | Admitting: Certified Nurse Midwife

## 2014-10-14 NOTE — Telephone Encounter (Signed)
Thank you for working with her! 

## 2014-10-14 NOTE — Telephone Encounter (Signed)
Patient calling to schedule an ultrasound. She has been waiting to hear from us.

## 2014-10-14 NOTE — Telephone Encounter (Signed)
Call to patient. Advised that we are still working on getting PUS approved by her insurance company. Patient is anxious to schedule. I advised that once we get an approval, we will contact her with benefits and proceed with scheduling.

## 2014-10-15 ENCOUNTER — Ambulatory Visit
Admission: RE | Admit: 2014-10-15 | Discharge: 2014-10-15 | Disposition: A | Discharge status: Home / Self Care | Payer: Managed Care, Other (non HMO) | Source: Ambulatory Visit

## 2014-10-15 DIAGNOSIS — Z139 Encounter for screening, unspecified: Secondary | ICD-10-CM

## 2014-10-17 NOTE — Progress Notes (Signed)
Encounter reviewed by Dr. Lateia Fraser Silva.  

## 2014-10-20 NOTE — Telephone Encounter (Signed)
Advised patient that we are still working on getting approval for her PUS. Advised that per her insurance company the procedure is diagnosis driven (meaning the plan only covers it for certain diagnosis').  Patient asked how much procedure would be if she paid out of pocket. Patient was advised of the charge amount. Will contact patient by Friday afternoon with whether or not the PUS has been approved based on the new diagnosis provided to her health plan. Patient agreeable.

## 2014-10-20 NOTE — Telephone Encounter (Signed)
Pt calling to check on status of insurance verification for an ultrasound.

## 2014-10-22 NOTE — Telephone Encounter (Signed)
Got approval for patients PUS to be covered using the following: Z87.42 history of PCOS and N93.8 DUB  I have placed a call to the patient to advise that service has been approved. She was not available so I left her a message to call back.

## 2014-11-17 ENCOUNTER — Telehealth: Payer: Self-pay | Admitting: Certified Nurse Midwife

## 2014-11-17 NOTE — Telephone Encounter (Signed)
LMTCB about canceled appt with DL °

## 2015-01-05 NOTE — Telephone Encounter (Signed)
We have not heard back from the patient yet. Do you want to close this encounter or what follow up is needed?

## 2015-01-05 NOTE — Telephone Encounter (Signed)
She needs another call back

## 2015-01-14 NOTE — Telephone Encounter (Signed)
Left message to call Kaitlyn at [phone]. 

## 2015-01-14 NOTE — Telephone Encounter (Signed)
Routing to triage for follow up per Kennon Rounds.

## 2015-01-14 NOTE — Telephone Encounter (Signed)
Spoke with patient. Patient states that she has a lot of other financial responsibilities going on and has been unable to schedule. Patient would like to know the cost for PUS. Spoke with billing regarding costs. Patient would like to return call to schedule. States she has children's graduation and other things going on at this time and will need to see if she can financially afford to have PUS performed.

## 2015-01-14 NOTE — Telephone Encounter (Signed)
Please let me know

## 2015-02-02 NOTE — Telephone Encounter (Signed)
This patient still has not gotten back to us. Please advise? °

## 2015-02-03 NOTE — Telephone Encounter (Signed)
Spoke with patient regarding the scheduling of PUS. Patient states that she does not want to proceed at this time. "If my discomfort heightens again I will call to schedule." Advised I will let Verner Choleborah S. Leonard CNM know. Advised to notify our office if she would like to proceed or if symptoms worsen. Patient is agreeable.

## 2015-02-03 NOTE — Telephone Encounter (Signed)
Ok to close chart at this point, I feel she will call if change.

## 2015-09-05 ENCOUNTER — Other Ambulatory Visit: Payer: Self-pay

## 2015-09-05 DIAGNOSIS — Z1231 Encounter for screening mammogram for malignant neoplasm of breast: Secondary | ICD-10-CM

## 2015-09-12 ENCOUNTER — Telehealth: Payer: Self-pay | Admitting: Certified Nurse Midwife

## 2015-09-12 DIAGNOSIS — N946 Dysmenorrhea, unspecified: Secondary | ICD-10-CM

## 2015-09-12 DIAGNOSIS — N920 Excessive and frequent menstruation with regular cycle: Secondary | ICD-10-CM

## 2015-09-12 NOTE — Telephone Encounter (Signed)
Ok to schedule, especially given change in cycles and prior recommendation.  Ok to do this without office visit first.  May need endometrial biopsy as well esp if considering endometrial ablation.  Thanks.

## 2015-09-12 NOTE — Telephone Encounter (Signed)
1. Patient called and said, "My PCP, Dr. Jethro Poling, recommended a follow up ultrasound after my physical since I had pain and sensitivity during the pelvic part of the exam. I am not coming for my annual exam with Debbi until April of this year and need to know what I should do until then. Is this something your office can help me with?"  Patient will request records from her visit be sent to our office for reference.  2. Patient also said, "I am interested in that procedure that burns off the lining of your uterus."  Dr. Jethro Poling [phone]  No paper chart in file.

## 2015-09-12 NOTE — Telephone Encounter (Signed)
Spoke with patient. Patient states that she was seen with her PCP, Dr.Norwood and had a pelvic exam due to increased bleeding with her cycles. Reports the pelvic exam was uncomfortable and she was advised she should be seen for a PUS with our office for further evaluation. Patient will have records sent from her OV for review. Denies any pelvic discomfort currently or following her pelvic exam. LMP was 1/21-1/23. During the first two days bleeding is increased with clotting. "I have to buy the ultra super absorbent tampons off [employer] and I am going through 1 box in two days. Then the third day is really light." The patient is not on any form of birth control. She was last seen in the office on 10/06/2014 with Leota Sauers CNM. At that visit PUS was recommended due to menorrhagia and dysmenorrhea. She was unable to schedule PUS at that time and was to monitor her bleeding and discomfort and return call if symptoms increased. Patient would like to proceed with PUS at this time and discuss possible ablation. Advised I will speak with Dr.Miller and return call with scheduling recommendations. She is agreeable.

## 2015-09-12 NOTE — Telephone Encounter (Signed)
Spoke with patient. Advised of message as seen below from Dr.Miller. She is agreeable and verbalizes understanding. Appointment for PUS and possible EMB scheduled for 09/29/2015 at 3:30 pm with 4 pm consult with Dr.Miller. She is agreeable to date and time. Orders placed for PUS and possible EMB for precert.  Cc: Harland Dingwall  Routing to provider for final review. Patient agreeable to disposition. Will close encounter.

## 2015-09-13 ENCOUNTER — Telehealth: Payer: Self-pay | Admitting: Obstetrics & Gynecology

## 2015-09-13 NOTE — Telephone Encounter (Signed)
Spoke with pt regarding benefits for ultrasound & endometrial biopsy. Patient understood and agreeable. Patient has already been scheduled for 09/29/15 with Hyacinth Meeker. Pt aware of arrival date and time. Pt aware of 72 hours cancellation policy with $100 fee. No further questions. Ok to close

## 2015-09-29 ENCOUNTER — Other Ambulatory Visit: Payer: Managed Care, Other (non HMO) | Admitting: Obstetrics & Gynecology

## 2015-09-29 ENCOUNTER — Other Ambulatory Visit: Payer: Managed Care, Other (non HMO)

## 2015-10-10 ENCOUNTER — Ambulatory Visit: Payer: Managed Care, Other (non HMO) | Admitting: Certified Nurse Midwife

## 2015-10-13 ENCOUNTER — Ambulatory Visit (INDEPENDENT_AMBULATORY_CARE_PROVIDER_SITE_OTHER): Payer: Managed Care, Other (non HMO)

## 2015-10-13 ENCOUNTER — Ambulatory Visit (INDEPENDENT_AMBULATORY_CARE_PROVIDER_SITE_OTHER): Payer: Managed Care, Other (non HMO) | Admitting: Obstetrics & Gynecology

## 2015-10-13 VITALS — BP 126/82 | HR 74 | Resp 16 | Ht 63.75 in | Wt 249.0 lb

## 2015-10-13 DIAGNOSIS — N946 Dysmenorrhea, unspecified: Secondary | ICD-10-CM | POA: Diagnosis not present

## 2015-10-13 DIAGNOSIS — N938 Other specified abnormal uterine and vaginal bleeding: Secondary | ICD-10-CM

## 2015-10-13 DIAGNOSIS — N920 Excessive and frequent menstruation with regular cycle: Secondary | ICD-10-CM

## 2015-10-13 LAB — FOLLICLE STIMULATING HORMONE: FSH: 7.4 m[IU]/mL

## 2015-10-13 NOTE — Progress Notes (Signed)
45 y.o. 61P1002 Married Caucasian female here for pelvic ultrasound due to menorrhagia.  Pt seen as new patient on 10/06/14 by Leota Sauerseborah Leonard.  [facility]  Pt reports cycles is very heavy for the first two days.  She can almost not leave home.  She uses pads and tampons and will need to change every hour.  She can pass clots up to the size of a quarter.  After two days, flow diminishes dramatically and she will then have light bleeding for another three days, or so.   Pt had normal pap and neg HR HPV 10/06/14.     Pt had complication after delivery of her daughter about 7 years ago.  Less than one week after cesarean section, she had colon perforation that was either related to diverticulitis or a complication of her cesarean section.  It was not clear at the time of x-lap.  Pt was hospitalized about three weeks, in total.  Pt very tearful about this today.  Very anxious about surgery, as well.  Would like to know options but is fearful hysterectomy is her only option.  Feels like she must have scar tissue on right side where colon surgery was performed due to pain that she experiences with her cycle.  It is always in the same location and down low on the left side.    Not on any contraception.  H/O PCOS.      No LMP recorded.  Contraception: none  Findings:  UTERUS:  6.7 x 4.1 x 4.3cm EMS: 5.415mm ADNEXA: Left ovary: 1.9 x 1.5 x 1.7cm       Right ovary: 3.3 x 2.3 x 2.5cm.  Ultrasound findings not diagnostic of PCOS CUL DE SAC: no free fluid  Discussion:  Reviewed findings with pt.  I would like to perform an endometrial biopsy due to her obesity and the amount of flow she is experiencing.  However, she is about mid cycle and is on nothing for contraception.  D/w pt will need to plan this later.  Options for treatment including combination OCPs, POPs, Implanon, Progesterone IUD, endometrial ablation and hysterectomy reviewed.  Brochures provided for future reference.   Given cardiovascular hx in family, migraine hx and obesity, I would prefer progesterone only method.  Pt is most interested in IUD.  She is aware this would give birth control and she is ok with this.  Her daughter is seven.  Will need to precert and would place under ultrasound guidance due to past surgical hx.  Pt in agreement with plan.  Assessment:  Menorrhagia in pt with normal PUS today H/O colon perforation about a week after cesarean section with subsequent three week hospitalization and anxiety about future abdominal/pelvic surgeries Obesity H/O migraines  Plan:  Will plan placement of Mirena or Kyleena IUD with onset of next cycle. Will also plan endometrial biopsy at the same time.   Will plan to place IUD with ultrasound guidance.    ~35 minutes spent with patient >50% of time was in face to face discussion of above.

## 2015-10-16 ENCOUNTER — Encounter: Payer: Self-pay | Admitting: Obstetrics & Gynecology

## 2015-10-16 DIAGNOSIS — N946 Dysmenorrhea, unspecified: Secondary | ICD-10-CM | POA: Insufficient documentation

## 2015-10-16 DIAGNOSIS — N920 Excessive and frequent menstruation with regular cycle: Secondary | ICD-10-CM | POA: Insufficient documentation

## 2015-10-17 ENCOUNTER — Telehealth: Payer: Self-pay | Admitting: *Deleted

## 2015-10-17 NOTE — Telephone Encounter (Signed)
Patient returning call.

## 2015-10-17 NOTE — Telephone Encounter (Signed)
Notes Recorded by [name], CMA on 10/17/2015 at 10:29 AM LM for pt to call back. Notes Recorded by [name], MD on 10/16/2015 at 4:29 PM Please let pt know FSH does not show menopausal range. We decided to proceed with IUD placement at visit. She just needs to call with onset of cycle. Toni Bowman is going to precert her IUD and let her know benefits as well. Thanks.

## 2015-10-17 NOTE — Telephone Encounter (Signed)
Pt notified.  Verbalized understanding.

## 2015-10-18 ENCOUNTER — Ambulatory Visit
Admission: RE | Admit: 2015-10-18 | Discharge: 2015-10-18 | Disposition: A | Discharge status: Home / Self Care | Payer: Managed Care, Other (non HMO) | Source: Ambulatory Visit

## 2015-10-18 DIAGNOSIS — Z1231 Encounter for screening mammogram for malignant neoplasm of breast: Secondary | ICD-10-CM

## 2015-11-29 ENCOUNTER — Ambulatory Visit: Payer: Managed Care, Other (non HMO) | Admitting: Certified Nurse Midwife

## 2015-12-20 ENCOUNTER — Emergency Department (HOSPITAL_BASED_OUTPATIENT_CLINIC_OR_DEPARTMENT_OTHER)
Admission: EM | Admit: 2015-12-20 | Discharge: 2015-12-20 | Disposition: A | Discharge status: Home / Self Care | Payer: 59 | Attending: Emergency Medicine | Admitting: Emergency Medicine

## 2015-12-20 ENCOUNTER — Emergency Department (HOSPITAL_BASED_OUTPATIENT_CLINIC_OR_DEPARTMENT_OTHER): Payer: 59

## 2015-12-20 ENCOUNTER — Encounter (HOSPITAL_BASED_OUTPATIENT_CLINIC_OR_DEPARTMENT_OTHER): Payer: Self-pay | Admitting: Emergency Medicine

## 2015-12-20 DIAGNOSIS — R079 Chest pain, unspecified: Secondary | ICD-10-CM | POA: Insufficient documentation

## 2015-12-20 LAB — COMPREHENSIVE METABOLIC PANEL
ALBUMIN: 4.1 g/dL (ref 3.5–5.0)
ALK PHOS: 75 U/L (ref 38–126)
ALT: 23 U/L (ref 14–54)
ANION GAP: 9 (ref 5–15)
AST: 23 U/L (ref 15–41)
BILIRUBIN TOTAL: 0.9 mg/dL (ref 0.3–1.2)
BUN: 8 mg/dL (ref 6–20)
CALCIUM: 9.2 mg/dL (ref 8.9–10.3)
CO2: 24 mmol/L (ref 22–32)
CREATININE: 0.54 mg/dL (ref 0.44–1.00)
Chloride: 106 mmol/L (ref 101–111)
GFR calc Af Amer: >60 mL/min (ref >60)
GFR calc non Af Amer: >60 mL/min (ref >60)
GLUCOSE: 93 mg/dL (ref 65–99)
Potassium: 3.5 mmol/L (ref 3.5–5.1)
Sodium: 139 mmol/L (ref 135–145)
TOTAL PROTEIN: 7.9 g/dL (ref 6.5–8.1)

## 2015-12-20 LAB — TROPONIN I
Troponin I: <0.03 ng/mL (ref <0.031)
Troponin I: <0.03 ng/mL (ref <0.031)

## 2015-12-20 LAB — CBC
HEMATOCRIT: 35.9 % — AB (ref 36.0–46.0)
HEMOGLOBIN: 11.8 g/dL — AB (ref 12.0–15.0)
MCH: 27.8 pg (ref 26.0–34.0)
MCHC: 32.9 g/dL (ref 30.0–36.0)
MCV: 84.7 fL (ref 78.0–100.0)
Platelets: 252 10*3/uL (ref 150–400)
RBC: 4.24 MIL/uL (ref 3.87–5.11)
RDW: 13.8 % (ref 11.5–15.5)
WBC: 7.4 10*3/uL (ref 4.0–10.5)

## 2015-12-20 NOTE — ED Notes (Signed)
PA at bedside discussing test results and dispo plan of care. 

## 2015-12-20 NOTE — ED Provider Notes (Signed)
CSN: [id number]     Arrival date & time 12/20/15  1711 History   First MD Initiated Contact with Patient 12/20/15 1759     Chief Complaint  Patient presents with  . Chest Pain   (Consider location/radiation/quality/duration/timing/severity/associated sxs/prior Treatment) HPI 45 y.o. female with a hx of HTN, presents to the Emergency Department today complaining of chest pain with sudden onset today around 1630. Pt states that she was sitting on the couch when she felt the pain. Noted Sharp 10/10 pain with radiation into the left axilla. Notes diaphoresis with nausea and shortness of breath. Pain lasted 15-20 minutes and the subsided. Pt noted additional attach a few minutes later after standing up with similar symptoms that lasted 30-40 minutes. No hx MI. Last stress test was 5-6 years ago an unremarkable. Pt does have FH of MI with mother, brother and father. Earliest being father at 55 years old. No fevers. No abd pain. No emesis. Noted Diarrhea after first episode. No headaches. No vision changes. No other symptoms noted. No pain currently.    Past Medical History  Diagnosis Date  . Frequent headaches   . Migraines   . Pleurisy   . Anxiety   . Dyspareunia   . PCOS (polycystic ovarian syndrome)   . Infertility, female    Past Surgical History  Procedure Laterality Date  . Cesarean section    . Exploratory laparotomy    . Colon surgery     Family History  Problem Relation Age of Onset  . Heart failure Father     HTN  . Heart attack Father   . Hypertension Father   . Heart failure Brother     HTN, high cholesterol   . Heart attack Brother     CABGx4  . Hyperlipidemia Brother   . Hypertension Brother   . Dysrhythmia Mother     AF, HTN, MI  . Heart attack Mother   . Hyperlipidemia Mother   . Hypertension Mother   . Thyroid disease Mother   . Hyperlipidemia Sister   . Hypertension Sister   . Alzheimer's disease Paternal Grandmother   . Hyperlipidemia Sister    Social  History  Substance Use Topics  . Smoking status: Never Smoker   . Smokeless tobacco: Never Used  . Alcohol Use: 1.8 oz/week    3 Standard drinks or equivalent per week     Comment: weekends   OB History    Gravida Para Term Preterm AB TAB SAB Ectopic Multiple Living   Review of Systems ROS reviewed and all are negative for acute change except as noted in the HPI.  Allergies  Review of patient's allergies indicates no known allergies.  Home Medications   Prior to Admission medications   Medication Sig Start Date End Date Taking? Authorizing Provider  ALPRAZolam Prudy Feeler) 0.5 MG tablet Take 0.5 mg by mouth at bedtime as needed for anxiety.    Historical Provider, MD  cetirizine (ZYRTEC) 10 MG tablet Take 10 mg by mouth daily.    Historical Provider, MD  FLUoxetine (PROZAC) 10 MG capsule Take 10 mg by mouth every morning. 09/01/14   Historical Provider, MD  Multiple Vitamin (MULTIVITAMIN WITH MINERALS) TABS Take 1 tablet by mouth daily.    Historical Provider, MD  SUMAtriptan (IMITREX) 100 MG tablet as needed.  07/13/14   Historical Provider, MD   BP 129/67 mmHg  Pulse 63  Temp(Src) 98.6 F (  37 C) (Oral)  Resp 24  Ht 5\' 3"  (1.6 m)  Wt 106.595 kg  BMI 41.64 kg/m2  SpO2 95%  LMP 12/10/2015 (Approximate)   Physical Exam  Constitutional: She is oriented to person, place, and time. She appears well-developed and well-nourished.  HENT:  Head: Normocephalic and atraumatic.  Eyes: EOM are normal. Pupils are equal, round, and reactive to light.  Neck: Normal range of motion. Neck supple. No tracheal deviation present.  Cardiovascular: Normal rate, regular rhythm, normal heart sounds and intact distal pulses.   No murmur heard. Pulmonary/Chest: Effort normal and breath sounds normal. No respiratory distress. She has no wheezes. She has no rales. She exhibits no tenderness.  Abdominal: Soft. Bowel sounds are normal. There is no tenderness.  Musculoskeletal: Normal  range of motion.  Neurological: She is alert and oriented to person, place, and time.  Skin: Skin is warm and dry.  Psychiatric: She has a normal mood and affect. Her behavior is normal. Thought content normal.  Nursing note and vitals reviewed.  ED Course  Procedures (including critical care time) Labs Review Labs Reviewed  CBC - Abnormal; Notable for the following:    Hemoglobin 11.8 (*)    HCT 35.9 (*)    All other components within normal limits  COMPREHENSIVE METABOLIC PANEL  TROPONIN I  TROPONIN I   Imaging Review Dg Chest 2 View  12/20/2015  CLINICAL DATA:  Chest pain for 2 hours EXAM: CHEST  2 VIEW COMPARISON:  12/17/2013 FINDINGS: Borderline enlargement of cardiac silhouette. Mediastinal contours and pulmonary vascularity normal. Lungs clear. No pleural effusion or pneumothorax. Osseous structures unremarkable. IMPRESSION: No acute abnormalities. Electronically Signed   By: Ulyses SouthwardMark  Boles M.D.   On: 12/20/2015 18:21   I have personally reviewed and evaluated these images and lab results as part of my medical decision-making.   EKG Interpretation   Date/Time:  Tuesday Dec 20 2015 17:17:36 EDT Ventricular Rate:  60 PR Interval:  146 QRS Duration: 80 QT Interval:  434 QTC Calculation: 434 R Axis:   55 Text Interpretation:  Normal sinus rhythm Normal ECG No significant change  since last tracing Confirmed by Anitra LauthPLUNKETT  MD, Alphonzo LemmingsWHITNEY ([id number]) on 12/20/2015  5:38:33 PM      MDM  I have reviewed and evaluated the relevant laboratory values I have reviewed and evaluated the relevant imaging studies.  I have interpreted the relevant EKG. I have reviewed the relevant previous healthcare records. I obtained HPI from historian. Patient discussed with supervising physician  ED Course:  Assessment: Pt is a 44yF presents with CP at 1630 today. Risk Factors HTN. No hx MI/ACS. Heart Score 2. Patient is to be discharged with recommendation to follow up with PCP in regards to today's  hospital visit. Pt could benefit from repeat stress test. Chest pain is not likely of cardiac or pulmonary etiology d/t presentation, perc negative, VSS, no tracheal deviation, no JVD or new murmur, RRR, breath sounds equal bilaterally, EKG without acute abnormalities, negative troponin x2, and negative CXR. Advised to return to the ED is CP becomes exertional, associated with diaphoresis or nausea, radiates to left jaw/arm, worsens or becomes concerning in any way. Pt appears reliable for follow up and is agreeable to discharge. Patient is in no acute distress. Vital Signs are stable. Patient is able to ambulate. Patient able to tolerate PO.   Disposition/Plan:  DC Home Additional Verbal discharge instructions given and discussed with patient.  Pt Instructed to f/u with PCP in the next week  for evaluation and treatment of symptoms.  Return precautions given Pt acknowledges and agrees with plan  Supervising Physician Gwyneth Sprout, MD   Final diagnoses:  Chest pain, unspecified chest pain type     Audry Pili, PA-C 12/20/15 2139  Gwyneth Sprout, MD 12/21/15 1610

## 2015-12-20 NOTE — ED Notes (Addendum)
Sudden onset of midsternal sharp CP at 1630 while sitting on couch watching TV.  Sts it started as sharp and is now dull.  Went away temporarily and now is back.  Nausea but no vomiting.  Also some pain in left axilla. Lips feel "tingly".  Also sts she is SOB and "can't catch my breath."  Hyperventilating in triage and crying.

## 2015-12-20 NOTE — Discharge Instructions (Signed)
Please read and follow all provided instructions.  Your diagnoses today include:  1. Chest pain, unspecified chest pain type    Tests performed today include:  An EKG of your heart  A chest x-ray  Cardiac enzymes - a blood test for heart muscle damage  Blood counts and electrolytes  Vital signs. See below for your results today.   Medications prescribed:   Take any prescribed medications only as directed.  Follow-up instructions: Please follow-up with your primary care provider as soon as you can for further evaluation of your symptoms.   Return instructions:  SEEK IMMEDIATE MEDICAL ATTENTION IF:  You have severe chest pain, especially if the pain is crushing or pressure-like and spreads to the arms, back, neck, or jaw, or if you have sweating, nausea (feeling sick to your stomach), or shortness of breath. THIS IS AN EMERGENCY. Don't wait to see if the pain will go away. Get medical help at once. Call 911 or [phone] (operator). DO NOT drive yourself to the hospital.   Your chest pain gets worse and does not go away with rest.   You have an attack of chest pain lasting longer than usual, despite rest and treatment with the medications your caregiver has prescribed.   You wake from sleep with chest pain or shortness of breath.  You feel dizzy or faint.  You have chest pain not typical of your usual pain for which you originally saw your caregiver.   You have any other emergent concerns regarding your health.  Additional Information: Chest pain comes from many different causes. Your caregiver has diagnosed you as having chest pain that is not specific for one problem, but does not require admission.  You are at low risk for an acute heart condition or other serious illness.   Your vital signs today were: BP 117/51 mmHg   Pulse 64   Temp(Src) 98.6 F (37 C) (Oral)   Resp 16   Ht 5\' 3"  (1.6 m)   Wt 106.595 kg   BMI 41.64 kg/m2   SpO2 100%   LMP 12/10/2015 (Approximate) If your  blood pressure (BP) was elevated above 135/85 this visit, please have this repeated by your doctor within one month. --------------

## 2016-05-14 ENCOUNTER — Telehealth: Payer: Self-pay | Admitting: Obstetrics & Gynecology

## 2016-05-14 DIAGNOSIS — N920 Excessive and frequent menstruation with regular cycle: Secondary | ICD-10-CM

## 2016-05-14 DIAGNOSIS — Z30430 Encounter for insertion of intrauterine contraceptive device: Secondary | ICD-10-CM

## 2016-05-14 NOTE — Telephone Encounter (Addendum)
Spoke with patient. Patient states that she started her menses yesterday and would like to schedule her IUD insertion. This will need to be scheduled under ultrasound guidance. Needs EMB at the same time. Patient would like to know benefits before scheduling. Advised benefits will be checked and she will be contacted with further information. She is agreeable.

## 2016-05-14 NOTE — Telephone Encounter (Signed)
Patient called stating, "I am on day two of my cycle and I need to get an IUD inserted. I have new insurance so I wanted to make you all aware of that as well."  New insurance details entered.

## 2016-05-14 NOTE — Telephone Encounter (Signed)
Left message to call Kaitlyn at [phone]. PLease see plan below from patient's OV with Dr.Miller on 10/13/2015.  Plan:  Will plan placement of Mirena or Kyleena IUD with onset of next cycle. Will also plan endometrial biopsy at the same time.   Will plan to place IUD with ultrasound guidance.

## 2016-05-15 ENCOUNTER — Ambulatory Visit (INDEPENDENT_AMBULATORY_CARE_PROVIDER_SITE_OTHER): Payer: 59

## 2016-05-15 ENCOUNTER — Ambulatory Visit (INDEPENDENT_AMBULATORY_CARE_PROVIDER_SITE_OTHER): Payer: 59 | Admitting: Obstetrics & Gynecology

## 2016-05-15 DIAGNOSIS — Z30430 Encounter for insertion of intrauterine contraceptive device: Secondary | ICD-10-CM

## 2016-05-15 DIAGNOSIS — N920 Excessive and frequent menstruation with regular cycle: Secondary | ICD-10-CM

## 2016-05-15 DIAGNOSIS — N946 Dysmenorrhea, unspecified: Secondary | ICD-10-CM | POA: Diagnosis not present

## 2016-05-15 MED ORDER — VALACYCLOVIR HCL 1 G PO TABS: ORAL_TABLET | ORAL | 1 refills | Status: DC

## 2016-05-15 MED ORDER — VALACYCLOVIR HCL 1 G PO TABS: ORAL_TABLET | ORAL | 1 refills | Status: AC

## 2016-05-15 NOTE — Telephone Encounter (Signed)
[name] reviewed benefits with patient and scheduled the patient for PUS guided IUD insertion with EMB on 05/15/2016 at 2 pm with 2:30 pm consult with Dr.Miller.  Routing to provider for final review. Patient agreeable to disposition. Will close encounter.

## 2016-05-15 NOTE — Progress Notes (Signed)
45 y.o. [id number] Married Caucasian female presents for insertion of IUD under ultrasound guidance due to prior surgical history of bowel perforation about one week after cesarean section and due to menorrhagia.  Pt's cycles are very heavy for two days each months. Bleeding is so heavy she cannot leave her home at times.  She can pass large clots as well.  Cycles are fairly regular but she reports the cycle this weekend was just "awful" with how heavy the bleeding was for her.    We discussed this in march when she had an ultrasound showing no fibroids.  Endometrium was 5.73mm.  Ovaries consistent with PCOS finding.  Due to severity of last few cycles, she has decided to proceed with IUD placement.  Also, she is not using any contraception and does not want to get pregnant again, so she will be using this for contraception.    She has been counseled about alternative options for bleeding and birth control including OCPs, progesterone options, sterilization procedures, condoms, and natural family planning.  She feels IUD is the better option for her right now.  Pt has also been counseled about risks and benefits as well as complications.  Consent is obtained today.  All questions answered prior to start of procedure.    Ultrasound was performed first.  Uterus measured 6.7 x 5.6 x 3.9cm with endometrium 2.54mm.  Left ovary 2.7 x 1.9cm and right ovary 3.9 x 2.1cm.    Current contraception:  none  LMP:  No LMP recorded.  Patient Active Problem List   Diagnosis Date Noted  . Menorrhagia with regular cycle 10/16/2015  . Dysmenorrhea 10/16/2015  . Headache 02/04/2014  . Chest pain 02/04/2014   Past Medical History:  Diagnosis Date  . Anxiety   . Dyspareunia   . Frequent headaches   . Infertility, female   . Migraines   . PCOS (polycystic ovarian syndrome)   . Pleurisy    Current Outpatient Prescriptions on File Prior to Visit  Medication Sig Dispense Refill  . ALPRAZolam (XANAX) 0.5 MG tablet  Take 0.5 mg by mouth at bedtime as needed for anxiety.    . cetirizine (ZYRTEC) 10 MG tablet Take 10 mg by mouth daily.    Marland Kitchen FLUoxetine (PROZAC) 10 MG capsule Take 10 mg by mouth every morning.  1  . Multiple Vitamin (MULTIVITAMIN WITH MINERALS) TABS Take 1 tablet by mouth daily.    . SUMAtriptan (IMITREX) 100 MG tablet as needed.   0   No current facility-administered medications on file prior to visit.    Review of patient's allergies indicates no known allergies.  Review of Systems  All other systems reviewed and are negative.  Gen:  WNWF healthy female NAD Abdomen: soft, non-tender Groin:  no inguinal nodes palpated  Pelvic exam: Vulva:  normal female genitalia Vagina:  normal vagina Cervix:  Non-tender, Negative CMT, no lesions or redness. Uterus:  mobile, non tender, mid position   Procedure:  Speculum reinserted.  Cervix visualized and cleansed with Betadine x 3.  Paracervical block was placed.  Single toothed tenaculum applied to anterior lip of cervix without difficulty.  Endometrial biopsy was performed first with Endometrial pipelle being passed to the fundus at 8cm.  Good tissue sample obtained with one pass of pipelle.  Then, Mirena IUD package was opened.  IUD and introducer passed to fundus and then withdrawn slightly before IUD was passed into endometrial cavity.  Introducer removed.  Strings cut to 2cm.  Tenaculum removed from cervix.  Minimal bleeding noted.  Pt tolerated the procedure well.  All instruments removed from vagina.  Ultrasound was used to confirm correct placement.    Lot #:[id number].  Exp:  02/20  A: Insertion of mirena IUD Contraception desires Menorrhagia Concerns about surgical risks due to prior surgeries  P:  Return for recheck 6-8 weeks Pt aware to call for any concerns Pt aware removal due no later than 05/15/2021.  IUD card given to pt.  ~15 minutes spent with patient, all in face to face discussion of plan and review of  options/risks/benefits before proceeding with planned procedure.  As pt had not been in office in almost six months, this was necessary to review recommendations made that many months ago.

## 2016-05-16 ENCOUNTER — Encounter: Payer: Self-pay | Admitting: Obstetrics & Gynecology

## 2016-05-18 ENCOUNTER — Telehealth: Payer: Self-pay

## 2016-05-18 NOTE — Telephone Encounter (Signed)
-----   Message from Patton SallesBrook E Amundson C Silva, MD sent at 05/17/2016  7:59 PM EDT ----- This is Dr. Edward JollySilva covering for Dr. Hyacinth MeekerMiller while she is out of the office.  Please report benign endometrial biopsy to the patient.  The endometrium was inactive.   No atypia, hyperplasia, or malignancy were seen.  Upon her return, Dr. Hyacinth MeekerMiller will be reviewing the biopsy and making any final recommendations.   Cc- Dr. Hyacinth MeekerMiller, Arnold LongEmily Caldwell.

## 2016-05-18 NOTE — Telephone Encounter (Signed)
Spoke with patient. Advised of message as seen below from Dr.Silva. Patient is agreeable and verbalizes understanding.  Cc: Dr.Miller for review and recommendations upon return to office.

## 2016-05-21 ENCOUNTER — Encounter: Payer: Self-pay | Admitting: Obstetrics & Gynecology

## 2016-06-26 ENCOUNTER — Encounter: Payer: Self-pay | Admitting: Obstetrics & Gynecology

## 2016-06-26 ENCOUNTER — Ambulatory Visit (INDEPENDENT_AMBULATORY_CARE_PROVIDER_SITE_OTHER): Payer: 59 | Admitting: Obstetrics & Gynecology

## 2016-06-26 VITALS — BP 108/56 | HR 76 | Resp 16 | Ht 63.0 in | Wt 239.8 lb

## 2016-06-26 DIAGNOSIS — N9089 Other specified noninflammatory disorders of vulva and perineum: Secondary | ICD-10-CM | POA: Diagnosis not present

## 2016-06-26 DIAGNOSIS — Z30431 Encounter for routine checking of intrauterine contraceptive device: Secondary | ICD-10-CM | POA: Diagnosis not present

## 2016-06-26 NOTE — Progress Notes (Signed)
GYNECOLOGY  VISIT   HPI: 45 y.o. 81P1002 Married Caucasian female here for IUD recheck after placement of Mirena IUD under ultrasound guidance on 05/15/16.  Pt reports cycle was much better and she had less pain and cramping as well.  Bleeding lasted long which was annoying.  D/w pt typical changes in bleeding and length of time until she has reached "max" improvement from IUD--6 months.  She is very please to heart this.  No pelvic pain.  Has not had intercourse yet, although I advised her otherwise the day of IUD placement.  She and I continue to be very apprehensive about having to consider any surgeries for her.  She is glad I am in agreement with this concern.  GYNECOLOGIC HISTORY: Patient's last menstrual period was 06/18/2016. Contraception: IUD  Patient Active Problem List   Diagnosis Date Noted  . Menorrhagia with regular cycle 10/16/2015  . Dysmenorrhea 10/16/2015  . Headache 02/04/2014  . Chest pain 02/04/2014    Past Medical History:  Diagnosis Date  . Anxiety   . Dyspareunia   . Frequent headaches   . Infertility, female   . Migraines   . PCOS (polycystic ovarian syndrome)   . Pleurisy     Past Surgical History:  Procedure Laterality Date  . CESAREAN SECTION    . COLON SURGERY    . EXPLORATORY LAPAROTOMY      MEDS:  Reviewed in EPIC and UTD  ALLERGIES: Patient has no known allergies.  Family History  Problem Relation Age of Onset  . Heart failure Father     HTN  . Heart attack Father   . Hypertension Father   . Heart failure Brother     HTN, high cholesterol   . Heart attack Brother     CABGx4  . Hyperlipidemia Brother   . Hypertension Brother   . Dysrhythmia Mother     AF, HTN, MI  . Heart attack Mother   . Hyperlipidemia Mother   . Hypertension Mother   . Thyroid disease Mother   . Hyperlipidemia Sister   . Hypertension Sister   . Alzheimer's disease Paternal Grandmother   . Hyperlipidemia Sister     SH:  Married, non smoker  Review of  Systems  All other systems reviewed and are negative.   PHYSICAL EXAMINATION:    BP (!) 108/56 (BP Location: Right Arm, Patient Position: Sitting, Cuff Size: Large)   Pulse 76   Resp 16   Ht 5\' 3"  (1.6 m)   Wt 239 lb 12.8 oz (108.8 kg)   LMP 06/18/2016   BMI 42.48 kg/m      Physical Exam  Constitutional: She appears well-developed and well-nourished.  GI: Soft. Bowel sounds are normal.  Genitourinary: Vagina normal and uterus normal.    There is no rash, tenderness, lesion or injury on the right labia. There is lesion on the left labia. Cervix exhibits no motion tenderness and no discharge. Right adnexum displays no mass, no tenderness and no fullness. Left adnexum displays fullness. Left adnexum displays no mass and no tenderness.  Genitourinary Comments: IUD strings noted and are 2cm.  Lymphadenopathy:       Right: No inguinal adenopathy present.       Left: No inguinal adenopathy present.  Neurological: She is alert.  Skin: Skin is warm and dry.  Psychiatric: She has a normal mood and affect.    Assessment: IUD recheck in pt with complicated surgical hx after cesarean section with perforated colon (unsure if complication  of cesarean section or related to diverticular disease). Chronic LLQ pain Menorrhagia and dysmenorrhea hx Vulvar lesion noted today most consistent with sebaceous cyst but is more firm than is typical--recent superficial infection?.  Also, I have noted this with prior two exams  Plan: Recheck 3-4 months to get better picture of how much bleeding improvement she will have. Also, if vulvar lesion still present will remove.  As pt has not noted issues with this area in the past, she is in agreement with plan.

## 2016-11-12 ENCOUNTER — Telehealth: Payer: Self-pay | Admitting: Obstetrics & Gynecology

## 2016-11-12 NOTE — Telephone Encounter (Signed)
Patient called and cancelled her appointment for a 6 month recheck on 11/13/16. She will call back to reschedule.

## 2016-11-13 ENCOUNTER — Ambulatory Visit: Payer: Self-pay | Admitting: Obstetrics & Gynecology

## 2016-11-26 NOTE — Telephone Encounter (Signed)
Called and left patient a message to call back to reschedule the cancelled appointment.

## 2016-12-07 ENCOUNTER — Telehealth: Payer: Self-pay | Admitting: Obstetrics & Gynecology

## 2016-12-07 NOTE — Telephone Encounter (Signed)
Patient reports she has concerns about "a new bump or a cyst" in her genital area. She has a 6 month recheck next Thursday, 12/13/16, but would like the nurses advice sooner.

## 2016-12-07 NOTE — Telephone Encounter (Signed)
Call to patient. Reports that external "bump" that Dr Hyacinth MeekerMiller noticed at last IUD check has recurred. Does not fully resolve. Labia is swollen enough to get caught on underclothes and cause discomfort.  Advised would recommend office visit for evaluation. Patient is already scheduled for Thursday 12-13-16 and states she is unable to move this appointment up due to work restrictions.  Advised she can do warm sitz bath and would encourage her to go without underclothes when possible. Advised cant really treat it until evaluation and advised again recommend office visit. Patient instructed to call back if increased pain, fever, drainage from area or if desires earlier appointment.  Advised urgent care is also available.  Patient states it is really not that bad most of the time, just the occasional time when labia gets caught on underclothes.  Routing to provider for final review. Patient agreeable to disposition. Will close encounter.

## 2016-12-10 ENCOUNTER — Ambulatory Visit: Payer: Self-pay | Admitting: Obstetrics & Gynecology

## 2016-12-13 ENCOUNTER — Ambulatory Visit (INDEPENDENT_AMBULATORY_CARE_PROVIDER_SITE_OTHER): Payer: 59 | Admitting: Obstetrics & Gynecology

## 2016-12-13 ENCOUNTER — Ambulatory Visit: Payer: Self-pay | Admitting: Obstetrics & Gynecology

## 2016-12-13 VITALS — BP 108/70 | HR 80 | Resp 16 | Ht 63.0 in | Wt 235.0 lb

## 2016-12-13 DIAGNOSIS — Z30431 Encounter for routine checking of intrauterine contraceptive device: Secondary | ICD-10-CM

## 2016-12-13 DIAGNOSIS — N938 Other specified abnormal uterine and vaginal bleeding: Secondary | ICD-10-CM | POA: Diagnosis not present

## 2016-12-13 DIAGNOSIS — N898 Other specified noninflammatory disorders of vagina: Secondary | ICD-10-CM | POA: Diagnosis not present

## 2016-12-13 DIAGNOSIS — Z975 Presence of (intrauterine) contraceptive device: Secondary | ICD-10-CM | POA: Diagnosis not present

## 2016-12-13 DIAGNOSIS — R3915 Urgency of urination: Secondary | ICD-10-CM

## 2016-12-13 DIAGNOSIS — N3946 Mixed incontinence: Secondary | ICD-10-CM | POA: Diagnosis not present

## 2016-12-13 LAB — POCT URINALYSIS DIPSTICK
Bilirubin, UA: NEGATIVE
Blood, UA: NEGATIVE
GLUCOSE UA: NEGATIVE
Ketones, UA: NEGATIVE
NITRITE UA: NEGATIVE
Protein, UA: NEGATIVE
pH, UA: 5 (ref 5.0–8.0)

## 2016-12-13 NOTE — Progress Notes (Signed)
GYNECOLOGY  VISIT   HPI: 46 y.o. [id number] Married Caucasian female here for follow up after IUD placement 05/15/16 as well as to discuss several other issues/concerns.  First, she reports her cycles are much better since IUD placement since May 15, 2017.  Cycles last two days, now, and using "light" products for these days.  This is a significant improvement and she is so pleased.  She's had recurrent issues with a sebacous vulvar cyst.  Was recently tender like it was infection but this has resolved.  Not sure if she needs to do anything about it at this time.  She does report some occasional bright red vaginal bleeding that has occurred outside of her cycle and sometimes occurs with intercourse.  Is short lasting and painless.  Not sure where this is coming from and/or if anything needs to be done.  She's also having some increased vaginal odor and discharge.  Was so significant a couple of weeks ago that she used and OTC douche which did help.  As the discharge has worsened, she feels she's having more pain with intercourse.  Not sure what to do about this.  Lastly, she's having worsening issues with incontinence.  She does have urgency but also leaks with laughing, coughing, sneezing, or lifting heavy objects.  Would like to pursue possible evlauation/treatment, if appropriate.  Denies dysuria, hematuria, or fevers.    GYNECOLOGIC HISTORY: Patient's last menstrual period was 12/06/2016. Contraception: Mirena IUD  Menopausal hormone therapy: None  Patient Active Problem List   Diagnosis Date Noted  . Menorrhagia with regular cycle 10/16/2015  . Dysmenorrhea 10/16/2015  . Headache 02/04/2014  . Chest pain 02/04/2014    Past Medical History:  Diagnosis Date  . Anxiety   . Dyspareunia   . Frequent headaches   . Infertility, female   . Migraines   . PCOS (polycystic ovarian syndrome)   . Pleurisy     Past Surgical History:  Procedure Laterality Date  . CESAREAN SECTION     . COLON SURGERY    . EXPLORATORY LAPAROTOMY      MEDS:  Reviewed in EPIC and UTD  ALLERGIES: Patient has no known allergies.  Family History  Problem Relation Age of Onset  . Heart failure Father        HTN  . Heart attack Father   . Hypertension Father   . Heart failure Brother        HTN, high cholesterol   . Heart attack Brother        CABGx4  . Hyperlipidemia Brother   . Hypertension Brother   . Dysrhythmia Mother        AF, HTN, MI  . Heart attack Mother   . Hyperlipidemia Mother   . Hypertension Mother   . Thyroid disease Mother   . Hyperlipidemia Sister   . Hypertension Sister   . Alzheimer's disease Paternal Grandmother   . Hyperlipidemia Sister     SH:  Married, non smoker  Review of Systems  Genitourinary: Positive for frequency and urgency.       Abnormal discharge Pain with intercourse   All other systems reviewed and are negative.   PHYSICAL EXAMINATION:    BP 108/70 (BP Location: Left Arm, Patient Position: Sitting, Cuff Size: Large)   Pulse 80   Resp 16   Ht 5\' 3"  (1.6 m)   Wt 235 lb (106.6 kg)   LMP 12/06/2016   BMI 41.63 kg/m     General appearance: alert, cooperative  and appears stated age Abdomen: soft, non-tender; bowel sounds normal; no masses,  no organomegaly  Pelvic: External genitalia:  Very small sebaceous cyst on left inferior labia, non tender and no surrounding erythema              Urethra:  normal appearing urethra with no masses, tenderness or lesions              Bartholins and Skenes: normal                 Vagina: normal appearing vagina with normal color.  Creamy yellowish discharge with some fishy odor noted.  Also, a vesicular lesion along posterior vaginal wall.  Recommended biopsy.  Verbal consent obtained.  Area cleansed with Betadine and biopsy forceps used to obtain biopsy.  Silver nitrate used for excellent hemostasis              Cervix: no lesions and IUD string noted              Bimanual Exam:  Uterus:   normal size, contour, position, consistency, mobility, non-tender              Adnexa: no mass, fullness, tenderness              Anus:  no lesions  Chaperone was present for exam.  Assessment: Vulvar sebaceous cyst that has no evidence of infection.  Advised to leave alone for now S/P mirena IUD placement 10/17 with significant improvement in bleeding Vaginal lesion that was biopsied today Vaginal discharge Urinary incontinence and urinary urgency  Plan: Biopsy results will be called to pt.  If granulation tissue, will treat with some topical estrogen Affirm obtained today Consider referral for urodynamics and possible surgical considerations Urine culture pending

## 2016-12-14 ENCOUNTER — Other Ambulatory Visit: Payer: Self-pay | Admitting: Obstetrics & Gynecology

## 2016-12-14 ENCOUNTER — Telehealth: Payer: Self-pay | Admitting: *Deleted

## 2016-12-14 LAB — WET PREP BY MOLECULAR PROBE
CANDIDA SPECIES: NOT DETECTED
Gardnerella vaginalis: DETECTED — AB
TRICHOMONAS VAG: NOT DETECTED

## 2016-12-14 LAB — URINE CULTURE

## 2016-12-14 MED ORDER — METRONIDAZOLE 500 MG PO TABS: 500.0000 mg | ORAL_TABLET | Freq: Two times a day (BID) | ORAL | 0 refills | Status: DC

## 2016-12-14 NOTE — Telephone Encounter (Signed)
Left message to call Noreene LarssonJill at [phone].  Attempted [phone] first, voicemail full, unable to leave message.

## 2016-12-14 NOTE — Telephone Encounter (Signed)
Spoke with patient, advised of results and recommendations as seen below per Dr. Hyacinth MeekerMiller. Rx for flagyl po to verified pharmacy on file. Patient verbalizes understanding and is agreeable.  Routing to provider for final review. Patient is agreeable to disposition. Will close encounter.

## 2016-12-14 NOTE — Telephone Encounter (Signed)
-----   Message from [name], MD sent at 12/14/2016  6:41 AM EDT ----- Please let her know BV was present.  Ok to treat with either flagyl 500mg  bid x 7 days or tindamax 2 gm daily x 2 days.  tindamax is generic but may be more expensive.  I did a vaginal biopsy yesterday so I don't want her to use a vaginal cream.

## 2016-12-15 ENCOUNTER — Encounter: Payer: Self-pay | Admitting: Obstetrics & Gynecology

## 2016-12-15 DIAGNOSIS — Z975 Presence of (intrauterine) contraceptive device: Secondary | ICD-10-CM | POA: Insufficient documentation

## 2016-12-18 ENCOUNTER — Other Ambulatory Visit: Payer: Self-pay | Admitting: *Deleted

## 2016-12-18 MED ORDER — ESTRADIOL 0.1 MG/GM VA CREA
TOPICAL_CREAM | VAGINAL | 0 refills | Status: DC
Start: 2016-12-18 — End: 2020-06-22

## 2017-01-02 ENCOUNTER — Encounter: Payer: Self-pay | Admitting: Obstetrics and Gynecology

## 2017-01-02 ENCOUNTER — Ambulatory Visit (INDEPENDENT_AMBULATORY_CARE_PROVIDER_SITE_OTHER): Payer: 59 | Admitting: Obstetrics and Gynecology

## 2017-01-02 VITALS — BP 116/60 | HR 64 | Resp 16 | Wt 235.0 lb

## 2017-01-02 DIAGNOSIS — R3915 Urgency of urination: Secondary | ICD-10-CM | POA: Diagnosis not present

## 2017-01-02 DIAGNOSIS — N393 Stress incontinence (female) (male): Secondary | ICD-10-CM

## 2017-01-02 DIAGNOSIS — N898 Other specified noninflammatory disorders of vagina: Secondary | ICD-10-CM | POA: Diagnosis not present

## 2017-01-02 LAB — POCT URINALYSIS DIPSTICK
BILIRUBIN UA: NEGATIVE
GLUCOSE UA: NEGATIVE
KETONES UA: NEGATIVE
Nitrite, UA: NEGATIVE
Protein, UA: NEGATIVE
Urobilinogen, UA: 0.2 E.U./dL
pH, UA: 7 (ref 5.0–8.0)

## 2017-01-02 NOTE — Progress Notes (Signed)
GYNECOLOGY  VISIT   HPI: 46 y.o.   Married  Caucasian  female   G1P1001 with Patient's last menstrual period was 12/06/2016.   here for urinary incontinence for about 2 months per patient. Referred by Dr. Hyacinth Meeker.   Last 3 - 4 months now has night time urination.  Leakage when goes from lying to sitting up.  Leaks with cough, laugh, and sneeze but not a lot. No leak during intercourse.  Void every 4 hours.  DF - once per night.  Notes underwear can be wet when she wakes up in the am.   Wearing pads now.  No dysuria.  No hematuria. No UTIs.  No hx pyelonephritis.   Does have hx of nephrolithiasis. Seen at [facility]. No treatment given.  No bladder or vaginal surgery.   Now using vaginal Estrace cream for granulation tissue, which was biopsied by Dr. Hyacinth Meeker on 12/13/16.  Manager at [employer]. Leaks with squatting or carrying something sometimes. Tried not to lift over 25 pounds at work.   Daughter is now almost 14 yo. She expresses appreciation for my care of her in the past.   Urine dip:  3+ blood and 2+ leukocytes.   GYNECOLOGIC HISTORY: Patient's last menstrual period was 12/06/2016. Contraception:  Mirena IUD -- inserted 05/15/16 Menopausal hormone therapy:  Estrace Last mammogram:  10/18/15 BIRADS 1 negative/density a Last pap smear:   10/08/14 Pap smear and HR HPV negative        OB History    Gravida Para Term Preterm AB Living   1 1 1     2    SAB TAB Ectopic Multiple Live Births           1         Patient Active Problem List   Diagnosis Date Noted  . IUD (intrauterine device) in place 12/15/2016  . Menorrhagia with regular cycle 10/16/2015  . Dysmenorrhea 10/16/2015  . Headache 02/04/2014  . Chest pain 02/04/2014    Past Medical History:  Diagnosis Date  . Anxiety   . Dyspareunia   . Frequent headaches   . Infertility, female   . Migraines   . PCOS (polycystic ovarian syndrome)   . Pleurisy     Past Surgical History:  Procedure Laterality  Date  . CESAREAN SECTION    . COLON SURGERY    . EXPLORATORY LAPAROTOMY      Current Outpatient Prescriptions  Medication Sig Dispense Refill  . cetirizine (ZYRTEC) 10 MG tablet Take 10 mg by mouth daily.    Marland Kitchen estradiol (ESTRACE) 0.1 MG/GM vaginal cream Place 1 gram vaginally twice weekly. 42.5 g 0  . Multiple Vitamin (MULTIVITAMIN WITH MINERALS) TABS Take 1 tablet by mouth daily.    . SUMAtriptan (IMITREX) 100 MG tablet as needed.   0  . valACYclovir (VALTREX) 1000 MG tablet Take 1 tablet as directed 30 tablet 1   No current facility-administered medications for this visit.      ALLERGIES: Patient has no known allergies.  Family History  Problem Relation Age of Onset  . Heart failure Father        HTN  . Heart attack Father   . Hypertension Father   . Heart failure Brother        HTN, high cholesterol   . Heart attack Brother        CABGx4  . Hyperlipidemia Brother   . Hypertension Brother   . Dysrhythmia Mother        AF,  HTN, MI  . Heart attack Mother   . Hyperlipidemia Mother   . Hypertension Mother   . Thyroid disease Mother   . Hyperlipidemia Sister   . Hypertension Sister   . Alzheimer's disease Paternal Grandmother   . Hyperlipidemia Sister     Social History   Social History  . Marital status: Married    Spouse name: N/A  . Number of children: N/A  . Years of education: N/A   Occupational History  . Not on file.   Social History Main Topics  . Smoking status: Never Smoker  . Smokeless tobacco: Never Used  . Alcohol use 1.8 oz/week    3 Standard drinks or equivalent per week     Comment: weekends  . Drug use: No  . Sexual activity: Yes    Partners: Male    Birth control/ protection: Condom, IUD     Comment: Mirena IUD   Other Topics Concern  . Not on file   Social History Narrative  . No narrative on file    ROS:  Pertinent items are noted in HPI.  PHYSICAL EXAMINATION:    BP 116/60 (BP Location: Right Arm, Patient Position:  Sitting, Cuff Size: Normal)   Pulse 64   Resp 16   Wt 235 lb (106.6 kg)   LMP 12/06/2016   BMI 41.63 kg/m     General appearance: alert, cooperative and appears stated age  Pelvic: External genitalia:  no lesions              Urethra:  normal appearing urethra with no masses, tenderness or lesions              Bartholins and Skenes: normal                 Vagina:  Left vaginal granulation tissue measuring about 1 cm along the left vaginal sidewall.               Cervix: no lesions.  IUD strings noted.  Some mucousy bleeding.   Good vaginal and uterine support.                Bimanual Exam:  Uterus:  normal size, contour, position, consistency, mobility, non-tender              Adnexa: no mass, fullness, tenderness              Rectal exam: Yes.  .  Confirms.              Anus:  normal sphincter tone, no lesions  Chaperone was present for exam.  ASSESSMENT  Stress incontinence.  Positive urine dip.  I think this is due to vaginal bleeding today. Vaginal granulation tissue.   PLAN  Comprehensive discussion regarding urinary incontinence.  We discussed etiologies as well as treatment options. We reviewed physical therapy, weight loss, and midurethral slings as treatment. Midurethral sling efficacy of 85 - 90% as well as specific potential risks of erosions/exposures of sling, cystotomy, urinary retention, de novo urgency and frequency, peri-operative UTIs, and slower voiding were reviewed. For now, the patient wishes to work on PT and weight loss.  PT referral placed. I support this as this will improve her outcome from surgery if she proceeds with this in the future.  Urine micro and culture.  I will communicate back to Dr. Hyacinth MeekerMiller regarding her visit today and a plan for removal of the vaginal granulation tissue as the patient is not proceeding with surgical care  in the near future.  An After Visit Summary was printed and given to the patient.  _25_____ minutes face to face  time of which over 50% was spent in counseling.

## 2017-01-03 ENCOUNTER — Telehealth: Payer: Self-pay | Admitting: *Deleted

## 2017-01-03 LAB — URINALYSIS, MICROSCOPIC ONLY
Bacteria, UA: NONE SEEN [HPF]
Casts: NONE SEEN [LPF]
Crystals: NONE SEEN [HPF]
Yeast: NONE SEEN [HPF]

## 2017-01-03 NOTE — Telephone Encounter (Signed)
Call to patient, unable to leave message, mailbox full.    Toni Bowman, Toni S, MD  [facility] Triage Pool  Pt had an area of granulation tissue biopsied in the vagina on 12/13/16. She needs to continue using the estrace vaginal cream 1 gm pv twice weekly. She has a follow up appt scheduled for July so I will see her then. It typically takes 2-3 months for this to work, if it is doing to work. If it doesn't we will discuss other treatment options when she comes back in July. She saw Dr. Edward JollySilva and it doesn't sound like she was really clear on the plan or follow-up. Please contact her and make sure she has a good understanding of the plan. Thanks.

## 2017-01-04 LAB — URINE CULTURE

## 2017-01-04 MED ORDER — AMPICILLIN 500 MG PO CAPS: 500.0000 mg | ORAL_CAPSULE | Freq: Four times a day (QID) | ORAL | 0 refills | Status: AC

## 2017-01-04 MED ORDER — FLUCONAZOLE 150 MG PO TABS: ORAL_TABLET | ORAL | 0 refills | Status: DC

## 2017-01-04 NOTE — Telephone Encounter (Signed)
-----   Message from Patton SallesBrook E Amundson C Silva, MD sent at 01/04/2017 10:14 AM EDT ----- Please inform patient of her positive UC showing group B strep. I am recommending tx with ampicillin 500 mg po qid x 7 days. # 28, RF none. She can have Dilfucan 150 mg po x 1 prn itching/burning/yeast infection.  May repeat in 72 hours prn  #2, RF none. Please send to pharmacy of choice.

## 2017-01-04 NOTE — Telephone Encounter (Signed)
Call to patient, mailbox full, unable to leave message.  

## 2017-01-04 NOTE — Telephone Encounter (Signed)
Spoke with patient, advised as seen below per Dr. Edward JollySilva and Dr. Hyacinth MeekerMiller. Patient states she will plan to f/u with Dr. Hyacinth MeekerMiller on 02/12/17, verbalizes understanding and is agreeable. Rx for diflucan and ampicillin to verified pharmacy.   Routing to provider for final review. Patient is agreeable to disposition. Will close encounter.

## 2017-01-09 ENCOUNTER — Telehealth: Payer: Self-pay | Admitting: Obstetrics & Gynecology

## 2017-01-09 NOTE — Telephone Encounter (Signed)
Patient says she was supposed to have an appointment with [facility] and is frustrated because they are giving her the run around and she does not know what else to do.

## 2017-01-10 NOTE — Telephone Encounter (Signed)
Spoke with patient. Patient states [facility] did call back with appointment information, appointment scheduled for 01/17/17 at 4pm. Patient thankful for f/u and is agreeable to date and time.  Routing to provider for final review. Patient is agreeable to disposition. Will close encounter.   Cc: [name], Dr. Hyacinth MeekerMiller

## 2017-01-10 NOTE — Telephone Encounter (Signed)
Spoke with Selena BattenKim at [facility], patient is scheduled for 01/17/17 at 4pm with Ruben GottronWilda Young. RN will call patient to notify.

## 2017-02-12 ENCOUNTER — Ambulatory Visit: Payer: Self-pay | Admitting: Obstetrics & Gynecology

## 2017-03-05 ENCOUNTER — Telehealth: Payer: Self-pay | Admitting: Obstetrics & Gynecology

## 2017-03-05 ENCOUNTER — Ambulatory Visit: Payer: Self-pay | Admitting: Obstetrics & Gynecology

## 2017-03-05 NOTE — Telephone Encounter (Signed)
Patient canceled her appointment today for follow medication. Patient was just seen at her PCP and has pneumonia. Patient will call later to reschedule.

## 2017-03-06 NOTE — Telephone Encounter (Signed)
Please do not apply any fees.  Thanks.  Ok to close encounter.

## 2017-04-10 ENCOUNTER — Encounter: Payer: Self-pay | Admitting: Obstetrics & Gynecology

## 2017-06-03 ENCOUNTER — Encounter (HOSPITAL_BASED_OUTPATIENT_CLINIC_OR_DEPARTMENT_OTHER): Payer: Self-pay | Admitting: Emergency Medicine

## 2017-06-03 ENCOUNTER — Emergency Department (HOSPITAL_BASED_OUTPATIENT_CLINIC_OR_DEPARTMENT_OTHER): Payer: 59

## 2017-06-03 DIAGNOSIS — K6289 Other specified diseases of anus and rectum: Secondary | ICD-10-CM | POA: Diagnosis present

## 2017-06-03 DIAGNOSIS — K5900 Constipation, unspecified: Secondary | ICD-10-CM | POA: Insufficient documentation

## 2017-06-03 DIAGNOSIS — K649 Unspecified hemorrhoids: Secondary | ICD-10-CM | POA: Insufficient documentation

## 2017-06-03 DIAGNOSIS — R110 Nausea: Secondary | ICD-10-CM | POA: Diagnosis not present

## 2017-06-03 DIAGNOSIS — Z79899 Other long term (current) drug therapy: Secondary | ICD-10-CM | POA: Diagnosis not present

## 2017-06-03 DIAGNOSIS — N3000 Acute cystitis without hematuria: Secondary | ICD-10-CM | POA: Insufficient documentation

## 2017-06-03 LAB — URINALYSIS, ROUTINE W REFLEX MICROSCOPIC
Bilirubin Urine: NEGATIVE
Glucose, UA: NEGATIVE mg/dL
KETONES UR: 15 mg/dL — AB
Nitrite: NEGATIVE
PROTEIN: NEGATIVE mg/dL
Specific Gravity, Urine: 1.015 (ref 1.005–1.030)
pH: 6.5 (ref 5.0–8.0)

## 2017-06-03 LAB — PREGNANCY, URINE: PREG TEST UR: NEGATIVE

## 2017-06-03 LAB — URINALYSIS, MICROSCOPIC (REFLEX)

## 2017-06-03 NOTE — ED Triage Notes (Signed)
C/o constipation x 4 days, nausea, rectal pain

## 2017-06-04 ENCOUNTER — Emergency Department (HOSPITAL_BASED_OUTPATIENT_CLINIC_OR_DEPARTMENT_OTHER)
Admission: EM | Admit: 2017-06-04 | Discharge: 2017-06-04 | Disposition: A | Discharge status: Home / Self Care | Payer: 59 | Attending: Emergency Medicine | Admitting: Emergency Medicine

## 2017-06-04 DIAGNOSIS — K5900 Constipation, unspecified: Secondary | ICD-10-CM

## 2017-06-04 DIAGNOSIS — N3000 Acute cystitis without hematuria: Secondary | ICD-10-CM

## 2017-06-04 MED ORDER — POLYETHYLENE GLYCOL 3350 17 G PO PACK: 17.0000 g | PACK | Freq: Two times a day (BID) | ORAL | 0 refills | Status: AC

## 2017-06-04 MED ORDER — CEPHALEXIN 500 MG PO CAPS: 500.0000 mg | ORAL_CAPSULE | Freq: Three times a day (TID) | ORAL | 0 refills | Status: DC

## 2017-06-04 MED ORDER — FLEET ENEMA 7-19 GM/118ML RE ENEM: 1.0000 | ENEMA | Freq: Every day | RECTAL | 0 refills | Status: AC | PRN

## 2017-06-04 NOTE — Discharge Instructions (Signed)
You were seen today for constipation.  You likely have a urinary tract infection as well.  Take MiraLAX 2 times daily until stools are soft.  You may also try a Fleet enema.  If you have not had a bowel movement in 1 day, increase MiraLAX to 3 times daily until you have a bowel movement.  Follow-up with your primary physician.

## 2017-06-04 NOTE — ED Notes (Signed)
ED Provider at bedside. 

## 2017-06-04 NOTE — ED Provider Notes (Signed)
[facility] EMERGENCY DEPARTMENT Provider Note   CSN: [id number] Arrival date & time: 06/03/17  2112     History   Chief Complaint Chief Complaint  Patient presents with  . Rectal Pain    HPI Toni Bowman is a 46 y.o. female.  HPI  This is a 46 year old female who presents with rectal pain.  Patient reports 4-day history of worsening constipation.  She states that normally she has bowel movements daily.  However, she has not had a bowel movement in the last 4 days.  She took Ex-Lax and Colace with no improvement.  She denies abdominal pain but does endorse some nausea.  She also endorses rectal discomfort and pressure.  She states it is hard to sit.  She denies any dysuria but does report urgency.  No known fevers.  Denies any chest pain, shortness of breath.  Past Medical History:  Diagnosis Date  . Anxiety   . Dyspareunia   . Frequent headaches   . Infertility, female   . Migraines   . PCOS (polycystic ovarian syndrome)   . Pleurisy     Patient Active Problem List   Diagnosis Date Noted  . IUD (intrauterine device) in place 12/15/2016  . Menorrhagia with regular cycle 10/16/2015  . Dysmenorrhea 10/16/2015  . Headache 02/04/2014  . Chest pain 02/04/2014    Past Surgical History:  Procedure Laterality Date  . CESAREAN SECTION    . COLON SURGERY    . EXPLORATORY LAPAROTOMY      OB History    Gravida Para Term Preterm AB Living   1 1 1     2    SAB TAB Ectopic Multiple Live Births           1       Home Medications    Prior to Admission medications   Medication Sig Start Date End Date Taking? Authorizing Provider  levonorgestrel (MIRENA) 20 MCG/24HR IUD 1 each by Intrauterine route once.   Yes [provider]  cephALEXin (KEFLEX) 500 MG capsule Take 1 capsule (500 mg total) by mouth 3 (three) times daily. 06/04/17   Donshay Lupinski, Mayer Masker, MD  cetirizine (ZYRTEC) 10 MG tablet Take 10 mg by mouth daily.    [provider]    estradiol (ESTRACE) 0.1 MG/GM vaginal cream Place 1 gram vaginally twice weekly. 12/18/16   Jerene Bears, MD  fluconazole (DIFLUCAN) 150 MG tablet Take one tablet po prn for itching/burning/yeast. May repeat in 72 hours if symptoms persist. 01/04/17   Patton Salles, MD  Multiple Vitamin (MULTIVITAMIN WITH MINERALS) TABS Take 1 tablet by mouth daily.    [provider]  polyethylene glycol (MIRALAX) packet Take 17 g by mouth 2 (two) times daily. 06/04/17   Saif Peter, Mayer Masker, MD  sodium phosphate (FLEET) 7-19 GM/118ML ENEM Place 133 mLs (1 enema total) rectally daily as needed for severe constipation. 06/04/17   Alitza Cowman, Mayer Masker, MD  SUMAtriptan (IMITREX) 100 MG tablet as needed.  07/13/14   [provider]  valACYclovir (VALTREX) 1000 MG tablet Take 1 tablet as directed 05/15/16   Jerene Bears, MD    Family History Family History  Problem Relation Age of Onset  . Heart failure Father        HTN  . Heart attack Father   . Hypertension Father   . Heart failure Brother        HTN, high cholesterol   . Heart attack Brother  CABGx4  . Hyperlipidemia Brother   . Hypertension Brother   . Dysrhythmia Mother        AF, HTN, MI  . Heart attack Mother   . Hyperlipidemia Mother   . Hypertension Mother   . Thyroid disease Mother   . Hyperlipidemia Sister   . Hypertension Sister   . Alzheimer's disease Paternal Grandmother   . Hyperlipidemia Sister     Social History Social History  Substance Use Topics  . Smoking status: Never Smoker  . Smokeless tobacco: Never Used  . Alcohol use 1.8 oz/week    3 Standard drinks or equivalent per week     Comment: weekends     Allergies   Patient has no known allergies.   Review of Systems Review of Systems  Constitutional: Negative for fever.  Respiratory: Negative for shortness of breath.   Cardiovascular: Negative for chest pain.  Gastrointestinal: Positive for constipation and nausea. Negative for  abdominal pain, blood in stool and vomiting.  Genitourinary: Positive for urgency. Negative for dysuria.  All other systems reviewed and are negative.    Physical Exam Updated Vital Signs BP 126/87   Pulse 99   Temp 99.5 F (37.5 C) (Oral)   Resp (!) 21   Ht 5\' 4"  (1.626 m)   Wt 106.6 kg (235 lb)   SpO2 99%   BMI 40.34 kg/m   Physical Exam  Constitutional: She is oriented to person, place, and time. She appears well-developed and well-nourished.  Obese, tearful  HENT:  Head: Normocephalic and atraumatic.  Cardiovascular: Normal rate, regular rhythm and normal heart sounds.   Pulmonary/Chest: Effort normal and breath sounds normal. No respiratory distress. She has no wheezes.  Abdominal: Soft. Bowel sounds are normal. There is no tenderness.  Genitourinary:  Genitourinary Comments: Multiple hemorrhoids noted on rectal exam, no thrombosis noted, no significant stool noted in the rectal vault, no fluctuance or tenderness about the rectum suggestive of abscess, no induration of the gluteal cleft suggestive of pilonidal  Neurological: She is alert and oriented to person, place, and time.  Skin: Skin is warm and dry.  Psychiatric: She has a normal mood and affect.  Nursing note and vitals reviewed.    ED Treatments / Results  Labs (all labs ordered are listed, but only abnormal results are displayed) Labs Reviewed  URINALYSIS, ROUTINE W REFLEX MICROSCOPIC - Abnormal; Notable for the following:       Result Value   APPearance CLOUDY (*)    Hgb urine dipstick SMALL (*)    Ketones, ur 15 (*)    Leukocytes, UA LARGE (*)    All other components within normal limits  URINALYSIS, MICROSCOPIC (REFLEX) - Abnormal; Notable for the following:    Bacteria, UA FEW (*)    Squamous Epithelial / LPF 0-5 (*)    All other components within normal limits  URINE CULTURE  PREGNANCY, URINE    EKG  EKG Interpretation None       Radiology Dg Abdomen 1 View  Result Date:  06/03/2017 CLINICAL DATA:  46 year old female with nausea and rectal pain. Constipation. EXAM: ABDOMEN - 1 VIEW COMPARISON:  Abdominal CT dated 04/06/2011 FINDINGS: There is moderate colonic stool burden. No bowel dilatation or evidence of obstruction. No free air or radiopaque calculi. An intrauterine device noted over the pelvis. The osseous structures and soft tissues appear unremarkable. IMPRESSION: Moderate colonic stool burden.  No bowel obstruction. Electronically Signed   By: Elgie Collard M.D.   On: 06/03/2017 22:55  Procedures Procedures (including critical care time)  Medications Ordered in ED Medications - No data to display   Initial Impression / Assessment and Plan / ED Course  I have reviewed the triage vital signs and the nursing notes.  Pertinent labs & imaging results that were available during my care of the patient were reviewed by me and considered in my medical decision making (see chart for details).     Patient presents with rectal discomfort and constipation.  She is nontoxic on exam.  Vital signs are reassuring.  She has no abdominal pain.  There is no significant stool in the rectal vault.  X-ray does showed moderate stool burden.  She also has too numerous to count white cells and few bacteria in her urine.  Urine culture was sent.  Given urgency would elect to treat.  There is no evidence of infection or abscess perirectally.  None of her hemorrhoids appear thrombosed.  Suspect her discomfort and pressure is likely related to retained stool.  Will recommend increasing bowel regimen to MiraLAX 2-3 times daily and enema.  Patient is agreeable to plan.  No other workup at this time.  After history, exam, and medical workup I feel the patient has been appropriately medically screened and is safe for discharge home. Pertinent diagnoses were discussed with the patient. Patient was given return precautions.   Final Clinical Impressions(s) / ED Diagnoses   Final  diagnoses:  Constipation, unspecified constipation type  Acute cystitis without hematuria    New Prescriptions New Prescriptions   CEPHALEXIN (KEFLEX) 500 MG CAPSULE    Take 1 capsule (500 mg total) by mouth 3 (three) times daily.   POLYETHYLENE GLYCOL (MIRALAX) PACKET    Take 17 g by mouth 2 (two) times daily.   SODIUM PHOSPHATE (FLEET) 7-19 GM/118ML ENEM    Place 133 mLs (1 enema total) rectally daily as needed for severe constipation.     Shon BatonHorton, Anida Deol F, MD 06/04/17 [phone]

## 2017-06-04 NOTE — ED Notes (Signed)
RN chaperoned EDP exam.

## 2017-06-05 LAB — URINE CULTURE: Culture: >=100000 — AB

## 2017-06-06 ENCOUNTER — Telehealth: Payer: Self-pay | Admitting: *Deleted

## 2017-06-06 NOTE — Telephone Encounter (Signed)
Post ED Visit - Positive Culture Follow-up  Culture report reviewed by antimicrobial stewardship pharmacist:  []  Enzo BiNathan Batchelder, Pharm.D. []  Celedonio MiyamotoJeremy Frens, Pharm.D., BCPS AQ-ID []  Garvin FilaMike Maccia, Pharm.D., BCPS [x]  Georgina PillionElizabeth Martin, Pharm.D., BCPS []  [place] Lake ShoresMinh Pham, [address] Rainbow BoulevardPharm.D., BCPS, AAHIVP []  Estella HuskMichelle Turner, Pharm.D., BCPS, AAHIVP []  Lysle Pearlachel Rumbarger, PharmD, BCPS []  Casilda Carlsaylor Stone, PharmD, BCPS []  Pollyann SamplesAndy Johnston, PharmD, BCPS  Positive urine culture Treated with Cephalexin, organism sensitive to the same and no further patient follow-up is required at this time.  Virl AxeRobertson, Malayiah Mcbrayer Talley 06/06/2017, 11:13 AM

## 2017-08-02 ENCOUNTER — Encounter: Payer: Self-pay | Admitting: Obstetrics & Gynecology

## 2019-04-24 ENCOUNTER — Telehealth: Payer: Self-pay | Admitting: Genetic Counselor

## 2019-04-24 NOTE — Telephone Encounter (Signed)
Called patient regarding upcoming Webex appointment, patient is notified and e-mail has been sent. °

## 2019-04-27 ENCOUNTER — Encounter: Payer: Self-pay | Admitting: Genetic Counselor

## 2019-04-27 ENCOUNTER — Inpatient Hospital Stay: Payer: 59 | Attending: Genetic Counselor | Admitting: Genetic Counselor

## 2019-04-27 ENCOUNTER — Inpatient Hospital Stay: Payer: 59

## 2019-04-27 DIAGNOSIS — Z808 Family history of malignant neoplasm of other organs or systems: Secondary | ICD-10-CM | POA: Diagnosis not present

## 2019-04-27 DIAGNOSIS — Z8052 Family history of malignant neoplasm of bladder: Secondary | ICD-10-CM | POA: Diagnosis not present

## 2019-04-27 NOTE — Progress Notes (Signed)
REFERRING PROVIDER: Lynnea Ferrier, MD [address] [facility] [place],  [place] [address]  PRIMARY PROVIDER:  Dolan Amen, MD  PRIMARY REASON FOR VISIT:  1. Family history of brain cancer   2. Family history of bladder cancer      HISTORY OF PRESENT ILLNESS:   I connected with Ms. Gradel on 04/27/2019 at 9 AM EDT by Webex video conference and verified that I am speaking with the correct person using two identifiers.   Patient location: Home Provider location: Office  Ms. Macconnell, a 48 y.o. female, was seen for a [place] cancer genetics consultation at the request of Dr. Graciela Husbands due to a family history of cancer.  Ms. Kotecki presents to clinic today to discuss the possibility of a hereditary predisposition to cancer, genetic testing, and to further clarify her future cancer risks, as well as potential cancer risks for family members.   Ms. Kashani is a 48 y.o. female with no personal history of cancer.    CANCER HISTORY:  Oncology History   No history exists.     RISK FACTORS:  Menarche was at age 48.  First live birth at age 48.  OCP use for approximately 20+ years.  Ovaries intact: yes.  Hysterectomy: no.  Menopausal status: perimenopausal.  HRT use: 0 years. Colonoscopy: yes; normal. Mammogram within the last year: yes. Number of breast biopsies: 0. Up to date with pelvic exams: yes. Any excessive radiation exposure in the past: no  Past Medical History:  Diagnosis Date  . Anxiety   . Dyspareunia   . Family history of bladder cancer   . Family history of brain cancer   . Frequent headaches   . Infertility, female   . Migraines   . PCOS (polycystic ovarian syndrome)   . Pleurisy     Past Surgical History:  Procedure Laterality Date  . CESAREAN SECTION    . COLON SURGERY    . EXPLORATORY LAPAROTOMY      Social History   Socioeconomic History  . Marital status: Married    Spouse name: Not on file  . Number of children:  Not on file  . Years of education: Not on file  . Highest education level: Not on file  Occupational History  . Not on file  Social Needs  . Financial resource strain: Not on file  . Food insecurity    Worry: Not on file    Inability: Not on file  . Transportation needs    Medical: Not on file    Non-medical: Not on file  Tobacco Use  . Smoking status: Never Smoker  . Smokeless tobacco: Never Used  Substance and Sexual Activity  . Alcohol use: Yes    Alcohol/week: 3.0 standard drinks    Types: 3 Standard drinks or equivalent per week    Comment: weekends  . Drug use: No  . Sexual activity: Yes    Partners: Male    Birth control/protection: Condom, I.U.D.    Comment: Mirena IUD  Lifestyle  . Physical activity    Days per week: Not on file    Minutes per session: Not on file  . Stress: Not on file  Relationships  . Social Musician on phone: Not on file    Gets together: Not on file    Attends religious service: Not on file    Active member of club or organization: Not on file    Attends meetings of clubs or  organizations: Not on file    Relationship status: Not on file  Other Topics Concern  . Not on file  Social History Narrative  . Not on file     FAMILY HISTORY:  We obtained a detailed, 4-generation family history.  Significant diagnoses are listed below: Family History  Problem Relation Age of Onset  . Heart failure Father        HTN  . Heart attack Father   . Hypertension Father   . Heart failure Brother        HTN, high cholesterol   . Heart attack Brother        CABGx4  . Hyperlipidemia Brother   . Hypertension Brother   . Throat cancer Brother 78       smoker  . Dysrhythmia Mother        AF, HTN, MI  . Heart attack Mother   . Hyperlipidemia Mother   . Hypertension Mother   . Thyroid disease Mother   . Hyperlipidemia Sister   . Hypertension Sister   . Alzheimer's disease Paternal Grandmother   . Dementia Paternal Grandmother   .  Hyperlipidemia Sister   . Brain cancer Sister 43       Glioblastoma stage II  . Cancer Maternal Uncle        unknown form  . Dementia Paternal Uncle   . Head & neck cancer Paternal Aunt 70       mouth, throat and lung cancer - smoker  . Bladder Cancer Cousin   . Leukemia Cousin        Chronic  . Brain cancer Cousin 1       pat cousin's son - Meduloblastoma    The patient has one daughter who is cancer free.  She has a brother and three sisters.  Her brother was a smoker and has developed throat cancer, and one sister was diagnosed with a glioblastoma at 78.  Her mother is living and her father is deceased.  The patient's father died of a heart attack at 66.  He had one brother and three sisters.  One sister had mouth, throat and lung cancer possibly as a result of smoking.  She had a son who was a smoker and had bladder cancer and chronic leukemia, and her daughter had a son who had a medulloblastoma at age 99.  The paternal grandparents are deceased from non-cancer related issues.  The patient's mother is living.  She had a brother and sister, the brother may have had cancer.  The maternal grandparents are deceased.  Ms. Chandran is unaware of previous family history of genetic testing for hereditary cancer risks. Patient's maternal ancestors are of Caucasian descent, and paternal ancestors are of Caucasian descent. There is no reported Ashkenazi Jewish ancestry. There is no known consanguinity.    GENETIC COUNSELING ASSESSMENT: Ms. Spickard is a 48 y.o. female with a family history of brain and bladder cancer which is somewhat suggestive of a sporadic cancer with a low predisposition to cancer given the smoking related cancers and distant brain cancers.. We, therefore, discussed and recommended the following at today's visit.   DISCUSSION: We discussed that 5 - 10% of cancer is hereditary. We reviewed her family history of cancer and compared that to hereditary cancer syndromes.  We  discussed that several individuals had cancers that could be more due to lifestyle than hereditary mutation in the family.  The most concerning cancer I see is in her cousin's son who was  diagnosed with a Medulloblastoma at age 3.  While he lived to age 62, a Medulloblastoma is a rare brain cancer, and we would have recommended genetic testing for it at this time.  We discussed that her sister, even with a glioblastoma, would have not be considered at high risk for a hereditary cancer syndrome based on her diagnosis of a glioblastoma. We did discuss that sometimes we can see both a glioblastoma and medulloblastoma occur in a syndrome called Burgess Amor syndrome.  However, that LFS is also associated with other soft tissue cancers, and most likely those would occur at young ages.   We discussed with Ms. Madson that the family history does not meet insurance or NCCN criteria for genetic testing and, therefore, is not highly consistent with a familial hereditary cancer syndrome.  We feel she is at low risk to harbor a gene mutation associated with such a condition. Thus, we did not recommend any genetic testing, at this time, and recommended Ms. Boeck continue to follow the cancer screening guidelines given by her primary healthcare provider.  PLAN: After considering the risks, benefits, and limitations, Ms. Meiner declined genetic testing at this time. We understand this decision and remain available to coordinate genetic testing at any time in the future. We, therefore, recommend Ms. Schrauben continue to follow the cancer screening guidelines given by her primary healthcare provider.  Lastly, we encouraged Ms. Cart to remain in contact with cancer genetics annually so that we can continuously update the family history and inform her of any changes in cancer genetics and testing that may be of benefit for this family.   Ms. Czepiel questions were answered to her satisfaction today. Our contact  information was provided should additional questions or concerns arise. Thank you for the referral and allowing [place] to share in the care of your patient.   Cynthis Purington P. Lowell Guitar, MS, [facility] Licensed, Patent attorney [email or web address] phone: [phone]  The patient was seen for a total of 45 minutes in face-to-face genetic counseling.  This patient was discussed with Drs. Magrinat, Pamelia Hoit and/or Mosetta Putt who agrees with the above.    _______________________________________________________________________ For Office Staff:  Number of people involved in session: 1 Was an Intern/ student involved with case: yes [employer]

## 2019-11-13 ENCOUNTER — Other Ambulatory Visit (HOSPITAL_COMMUNITY): Payer: Self-pay | Admitting: Internal Medicine

## 2019-11-13 ENCOUNTER — Other Ambulatory Visit: Payer: Self-pay | Admitting: Internal Medicine

## 2019-11-13 DIAGNOSIS — K5792 Diverticulitis of intestine, part unspecified, without perforation or abscess without bleeding: Secondary | ICD-10-CM

## 2019-11-16 ENCOUNTER — Other Ambulatory Visit (HOSPITAL_COMMUNITY)
Admission: RE | Admit: 2019-11-16 | Discharge: 2019-11-16 | Disposition: A | Discharge status: Home / Self Care | Payer: 59 | Source: Other Acute Inpatient Hospital | Attending: Internal Medicine | Admitting: Internal Medicine

## 2019-11-16 DIAGNOSIS — R109 Unspecified abdominal pain: Secondary | ICD-10-CM | POA: Insufficient documentation

## 2019-11-16 LAB — CBC
HCT: 39.1 % (ref 36.0–46.0)
Hemoglobin: 12.3 g/dL (ref 12.0–15.0)
MCH: 29.1 pg (ref 26.0–34.0)
MCHC: 31.5 g/dL (ref 30.0–36.0)
MCV: 92.4 fL (ref 80.0–100.0)
Platelets: 327 10*3/uL (ref 150–400)
RBC: 4.23 MIL/uL (ref 3.87–5.11)
RDW: 12.8 % (ref 11.5–15.5)
WBC: 5.4 10*3/uL (ref 4.0–10.5)
nRBC: 0 % (ref 0.0–0.2)

## 2019-11-16 LAB — COMPREHENSIVE METABOLIC PANEL
ALT: 22 U/L (ref 0–44)
AST: 21 U/L (ref 15–41)
Albumin: 3.8 g/dL (ref 3.5–5.0)
Alkaline Phosphatase: 73 U/L (ref 38–126)
Anion gap: 10 (ref 5–15)
BUN: 7 mg/dL (ref 6–20)
CO2: 26 mmol/L (ref 22–32)
Calcium: 8.8 mg/dL — ABNORMAL LOW (ref 8.9–10.3)
Chloride: 102 mmol/L (ref 98–111)
Creatinine, Ser: 0.57 mg/dL (ref 0.44–1.00)
GFR calc Af Amer: >60 mL/min (ref >60)
GFR calc non Af Amer: >60 mL/min (ref >60)
Glucose, Bld: 86 mg/dL (ref 70–99)
Potassium: 4.5 mmol/L (ref 3.5–5.1)
Sodium: 138 mmol/L (ref 135–145)
Total Bilirubin: 0.8 mg/dL (ref 0.3–1.2)
Total Protein: 7.4 g/dL (ref 6.5–8.1)

## 2019-11-18 ENCOUNTER — Ambulatory Visit (HOSPITAL_COMMUNITY)
Admission: RE | Admit: 2019-11-18 | Discharge: 2019-11-18 | Disposition: A | Discharge status: Home / Self Care | Payer: 59 | Source: Ambulatory Visit | Attending: Internal Medicine | Admitting: Internal Medicine

## 2019-11-18 ENCOUNTER — Other Ambulatory Visit: Payer: Self-pay

## 2019-11-18 DIAGNOSIS — K5792 Diverticulitis of intestine, part unspecified, without perforation or abscess without bleeding: Secondary | ICD-10-CM | POA: Insufficient documentation

## 2019-11-18 MED ORDER — IOHEXOL 300 MG/ML  SOLN
100.0000 mL | Freq: Once | INTRAMUSCULAR | Status: AC | PRN
  Administered 2019-11-18: 100 mL via INTRAVENOUS

## 2019-12-15 ENCOUNTER — Other Ambulatory Visit
Admission: RE | Admit: 2019-12-15 | Discharge: 2019-12-15 | Disposition: A | Discharge status: Home / Self Care | Payer: 59 | Source: Ambulatory Visit | Attending: Student | Admitting: Student

## 2019-12-15 DIAGNOSIS — R197 Diarrhea, unspecified: Secondary | ICD-10-CM | POA: Diagnosis present

## 2019-12-15 LAB — GASTROINTESTINAL PANEL BY PCR, STOOL (REPLACES STOOL CULTURE)

## 2019-12-15 LAB — C DIFFICILE QUICK SCREEN W PCR REFLEX
C Diff antigen: NEGATIVE
C Diff interpretation: NOT DETECTED
C Diff toxin: NEGATIVE

## 2020-01-27 ENCOUNTER — Other Ambulatory Visit: Payer: Self-pay | Admitting: Internal Medicine

## 2020-01-27 DIAGNOSIS — K5792 Diverticulitis of intestine, part unspecified, without perforation or abscess without bleeding: Secondary | ICD-10-CM

## 2020-02-03 ENCOUNTER — Ambulatory Visit (HOSPITAL_COMMUNITY)
Admission: RE | Admit: 2020-02-03 | Discharge: 2020-02-03 | Disposition: A | Discharge status: Home / Self Care | Payer: 59 | Source: Ambulatory Visit | Attending: Internal Medicine | Admitting: Internal Medicine

## 2020-02-03 ENCOUNTER — Other Ambulatory Visit: Payer: Self-pay

## 2020-02-03 DIAGNOSIS — K5792 Diverticulitis of intestine, part unspecified, without perforation or abscess without bleeding: Secondary | ICD-10-CM | POA: Diagnosis present

## 2020-02-03 MED ORDER — IOHEXOL 300 MG/ML  SOLN
100.0000 mL | Freq: Once | INTRAMUSCULAR | Status: AC | PRN
  Administered 2020-02-03: 100 mL via INTRAVENOUS

## 2020-02-03 MED ORDER — SODIUM CHLORIDE (PF) 0.9 % IJ SOLN
INTRAMUSCULAR | Status: AC
  Filled 2020-02-03: qty 50

## 2020-03-01 ENCOUNTER — Other Ambulatory Visit
Admission: RE | Admit: 2020-03-01 | Discharge: 2020-03-01 | Disposition: A | Discharge status: Home / Self Care | Payer: 59 | Attending: Internal Medicine | Admitting: Internal Medicine

## 2020-03-01 DIAGNOSIS — R0789 Other chest pain: Secondary | ICD-10-CM | POA: Diagnosis present

## 2020-03-01 LAB — TROPONIN I (HIGH SENSITIVITY): Troponin I (High Sensitivity): 5 ng/L (ref <18)

## 2020-06-09 ENCOUNTER — Other Ambulatory Visit
Admission: RE | Admit: 2020-06-09 | Discharge: 2020-06-09 | Disposition: A | Discharge status: Home / Self Care | Payer: 59 | Source: Ambulatory Visit | Attending: Internal Medicine | Admitting: Internal Medicine

## 2020-06-09 DIAGNOSIS — Z01818 Encounter for other preprocedural examination: Secondary | ICD-10-CM | POA: Insufficient documentation

## 2020-06-09 DIAGNOSIS — I208 Other forms of angina pectoris: Secondary | ICD-10-CM | POA: Diagnosis not present

## 2020-06-09 LAB — BRAIN NATRIURETIC PEPTIDE: B Natriuretic Peptide: 57.1 pg/mL (ref 0.0–100.0)

## 2020-06-23 ENCOUNTER — Other Ambulatory Visit
Admission: RE | Admit: 2020-06-23 | Discharge: 2020-06-23 | Disposition: A | Discharge status: Home / Self Care | Payer: 59 | Source: Ambulatory Visit | Attending: Internal Medicine | Admitting: Internal Medicine

## 2020-06-23 ENCOUNTER — Other Ambulatory Visit: Payer: Self-pay

## 2020-06-23 DIAGNOSIS — Z20822 Contact with and (suspected) exposure to covid-19: Secondary | ICD-10-CM | POA: Insufficient documentation

## 2020-06-23 DIAGNOSIS — Z01812 Encounter for preprocedural laboratory examination: Secondary | ICD-10-CM | POA: Insufficient documentation

## 2020-06-24 LAB — SARS CORONAVIRUS 2 (TAT 6-24 HRS): SARS Coronavirus 2: NEGATIVE

## 2020-06-27 ENCOUNTER — Other Ambulatory Visit: Admission: RE | Admit: 2020-06-27 | Payer: 59 | Source: Ambulatory Visit

## 2020-06-27 DIAGNOSIS — R9430 Abnormal result of cardiovascular function study, unspecified: Secondary | ICD-10-CM

## 2020-06-29 DIAGNOSIS — R9430 Abnormal result of cardiovascular function study, unspecified: Secondary | ICD-10-CM

## 2020-07-07 ENCOUNTER — Inpatient Hospital Stay: Admission: RE | Admit: 2020-07-07 | Payer: 59 | Source: Ambulatory Visit

## 2020-07-08 ENCOUNTER — Encounter
Admission: RE | Admit: 2020-07-08 | Discharge: 2020-07-08 | Disposition: A | Discharge status: Home / Self Care | Payer: 59 | Source: Ambulatory Visit | Attending: Internal Medicine | Admitting: Internal Medicine

## 2020-07-08 ENCOUNTER — Other Ambulatory Visit: Payer: Self-pay

## 2020-07-08 DIAGNOSIS — Z01812 Encounter for preprocedural laboratory examination: Secondary | ICD-10-CM | POA: Insufficient documentation

## 2020-07-08 DIAGNOSIS — Z20822 Contact with and (suspected) exposure to covid-19: Secondary | ICD-10-CM | POA: Diagnosis not present

## 2020-07-09 LAB — SARS CORONAVIRUS 2 (TAT 6-24 HRS): SARS Coronavirus 2: NEGATIVE

## 2020-07-11 ENCOUNTER — Encounter
Admission: RE | Disposition: A | Discharge status: Home / Self Care | Payer: Self-pay | Source: Home / Self Care | Attending: Internal Medicine

## 2020-07-11 ENCOUNTER — Other Ambulatory Visit: Payer: Self-pay

## 2020-07-11 ENCOUNTER — Encounter: Payer: Self-pay | Admitting: Internal Medicine

## 2020-07-11 ENCOUNTER — Ambulatory Visit
Admission: RE | Admit: 2020-07-11 | Discharge: 2020-07-11 | Disposition: A | Discharge status: Home / Self Care | Payer: 59 | Attending: Internal Medicine | Admitting: Internal Medicine

## 2020-07-11 DIAGNOSIS — Z8616 Personal history of COVID-19: Secondary | ICD-10-CM | POA: Diagnosis not present

## 2020-07-11 DIAGNOSIS — E785 Hyperlipidemia, unspecified: Secondary | ICD-10-CM | POA: Insufficient documentation

## 2020-07-11 DIAGNOSIS — R9430 Abnormal result of cardiovascular function study, unspecified: Secondary | ICD-10-CM | POA: Diagnosis present

## 2020-07-11 DIAGNOSIS — I10 Essential (primary) hypertension: Secondary | ICD-10-CM | POA: Insufficient documentation

## 2020-07-11 DIAGNOSIS — Z79899 Other long term (current) drug therapy: Secondary | ICD-10-CM | POA: Diagnosis not present

## 2020-07-11 HISTORY — PX: LEFT HEART CATH AND CORONARY ANGIOGRAPHY: CATH118249

## 2020-07-11 SURGERY — LEFT HEART CATH AND CORONARY ANGIOGRAPHY
Anesthesia: Moderate Sedation | Laterality: Left

## 2020-07-11 MED ORDER — HEPARIN (PORCINE) IN NACL 1000-0.9 UT/500ML-% IV SOLN
INTRAVENOUS | Status: DC | PRN
  Administered 2020-07-11: 500 mL

## 2020-07-11 MED ORDER — HYDRALAZINE HCL 20 MG/ML IJ SOLN: 10.0000 mg | INTRAMUSCULAR | Status: DC | PRN

## 2020-07-11 MED ORDER — ACETAMINOPHEN 325 MG PO TABS: 650.0000 mg | ORAL_TABLET | ORAL | Status: DC | PRN

## 2020-07-11 MED ORDER — SODIUM CHLORIDE 0.9 % WEIGHT BASED INFUSION
3.0000 mL/kg/h | INTRAVENOUS | Status: AC
  Administered 2020-07-11: 3 mL/kg/h via INTRAVENOUS

## 2020-07-11 MED ORDER — LABETALOL HCL 5 MG/ML IV SOLN: 10.0000 mg | INTRAVENOUS | Status: DC | PRN

## 2020-07-11 MED ORDER — ONDANSETRON HCL 4 MG/2ML IJ SOLN: 4.0000 mg | Freq: Four times a day (QID) | INTRAMUSCULAR | Status: DC | PRN

## 2020-07-11 MED ORDER — VERAPAMIL HCL 2.5 MG/ML IV SOLN
INTRAVENOUS | Status: AC
  Filled 2020-07-11: qty 2

## 2020-07-11 MED ORDER — ASPIRIN 81 MG PO CHEW: 81.0000 mg | CHEWABLE_TABLET | ORAL | Status: DC

## 2020-07-11 MED ORDER — VERAPAMIL HCL 2.5 MG/ML IV SOLN
INTRAVENOUS | Status: DC | PRN
  Administered 2020-07-11: 2.5 mg via INTRA_ARTERIAL

## 2020-07-11 MED ORDER — LIDOCAINE HCL (PF) 1 % IJ SOLN
INTRAMUSCULAR | Status: AC
  Filled 2020-07-11: qty 30

## 2020-07-11 MED ORDER — SODIUM CHLORIDE 0.9% FLUSH: 3.0000 mL | Freq: Two times a day (BID) | INTRAVENOUS | Status: DC

## 2020-07-11 MED ORDER — ASPIRIN 81 MG PO CHEW
CHEWABLE_TABLET | ORAL | Status: AC
  Administered 2020-07-11: 81 mg
  Filled 2020-07-11: qty 1

## 2020-07-11 MED ORDER — SODIUM CHLORIDE 0.9% FLUSH: 3.0000 mL | INTRAVENOUS | Status: DC | PRN

## 2020-07-11 MED ORDER — SODIUM CHLORIDE 0.9 % WEIGHT BASED INFUSION
1.0000 mL/kg/h | INTRAVENOUS | Status: DC
  Administered 2020-07-11: 1 mL/kg/h via INTRAVENOUS

## 2020-07-11 MED ORDER — HEPARIN SODIUM (PORCINE) 1000 UNIT/ML IJ SOLN
INTRAMUSCULAR | Status: AC
  Filled 2020-07-11: qty 1

## 2020-07-11 MED ORDER — IOHEXOL 300 MG/ML  SOLN
INTRAMUSCULAR | Status: DC | PRN
  Administered 2020-07-11: 55 mL

## 2020-07-11 MED ORDER — MIDAZOLAM HCL 2 MG/2ML IJ SOLN
INTRAMUSCULAR | Status: AC
  Filled 2020-07-11: qty 2

## 2020-07-11 MED ORDER — HEPARIN (PORCINE) IN NACL 1000-0.9 UT/500ML-% IV SOLN
INTRAVENOUS | Status: AC
  Filled 2020-07-11: qty 1000

## 2020-07-11 MED ORDER — HEPARIN SODIUM (PORCINE) 1000 UNIT/ML IJ SOLN
INTRAMUSCULAR | Status: DC | PRN
  Administered 2020-07-11: 5000 [IU] via INTRAVENOUS

## 2020-07-11 MED ORDER — SODIUM CHLORIDE 0.9 % IV SOLN: 250.0000 mL | INTRAVENOUS | Status: DC | PRN

## 2020-07-11 MED ORDER — FENTANYL CITRATE (PF) 100 MCG/2ML IJ SOLN
INTRAMUSCULAR | Status: AC
  Filled 2020-07-11: qty 2

## 2020-07-11 MED ORDER — MIDAZOLAM HCL 2 MG/2ML IJ SOLN
INTRAMUSCULAR | Status: DC | PRN
  Administered 2020-07-11 (×2): 1 mg via INTRAVENOUS

## 2020-07-11 MED ORDER — FENTANYL CITRATE (PF) 100 MCG/2ML IJ SOLN
INTRAMUSCULAR | Status: DC | PRN
  Administered 2020-07-11: 50 ug via INTRAVENOUS
  Administered 2020-07-11: 25 ug via INTRAVENOUS

## 2020-07-11 MED ORDER — SODIUM CHLORIDE 0.9 % WEIGHT BASED INFUSION: 1.0000 mL/kg/h | INTRAVENOUS | Status: DC

## 2020-07-11 SURGICAL SUPPLY — 8 items
CATH INFINITI 5 FR JL3.5 (CATHETERS) ×3 IMPLANT
CATH INFINITI JR4 5F (CATHETERS) ×3 IMPLANT
DEVICE RAD COMP TR BAND LRG (VASCULAR PRODUCTS) ×3 IMPLANT
GLIDESHEATH SLEND SS 6F .021 (SHEATH) ×3 IMPLANT
GUIDEWIRE INQWIRE 1.5J.035X260 (WIRE) ×1 IMPLANT
INQWIRE 1.5J .035X260CM (WIRE) ×3
KIT MANI 3VAL PERCEP (MISCELLANEOUS) ×3 IMPLANT
PACK CARDIAC CATH (CUSTOM PROCEDURE TRAY) ×3 IMPLANT

## 2020-07-11 NOTE — Discharge Instructions (Signed)
 Radial Site Care  This sheet gives you information about how to care for yourself after your procedure. Your health care provider may also give you more specific instructions. If you have problems or questions, contact your health care provider. What can I expect after the procedure? After the procedure, it is common to have:  Bruising and tenderness at the catheter insertion area. Follow these instructions at home: Medicines  Take over-the-counter and prescription medicines only as told by your health care provider. Insertion site care  Follow instructions from your health care provider about how to take care of your insertion site. Make sure you: ? Wash your hands with soap and water before you change your bandage (dressing). If soap and water are not available, use hand sanitizer. ? Change your dressing as told by your health care provider. ? Leave stitches (sutures), skin glue, or adhesive strips in place. These skin closures may need to stay in place for 2 weeks or longer. If adhesive strip edges start to loosen and curl up, you may trim the loose edges. Do not remove adhesive strips completely unless your health care provider tells you to do that.  Check your insertion site every day for signs of infection. Check for: ? Redness, swelling, or pain. ? Fluid or blood. ? Pus or a bad smell. ? Warmth.  Do not take baths, swim, or use a hot tub until your health care provider approves.  You may shower 24-48 hours after the procedure, or as directed by your health care provider. ? Remove the dressing and gently wash the site with plain soap and water. ? Pat the area dry with a clean towel. ? Do not rub the site. That could cause bleeding.  Do not apply powder or lotion to the site. Activity   For 24 hours after the procedure, or as directed by your health care provider: ? Do not flex or bend the affected arm. ? Do not push or pull heavy objects with the affected arm. ? Do not  drive yourself home from the hospital or clinic. You may drive 24 hours after the procedure unless your health care provider tells you not to. ? Do not operate machinery or power tools.  Do not lift anything that is heavier than 10 lb (4.5 kg), or the limit that you are told, until your health care provider says that it is safe.  Ask your health care provider when it is okay to: ? Return to work or school. ? Resume usual physical activities or sports. ? Resume sexual activity. General instructions  If the catheter site starts to bleed, raise your arm and put firm pressure on the site. If the bleeding does not stop, get help right away. This is a medical emergency.  If you went home on the same day as your procedure, a responsible adult should be with you for the first 24 hours after you arrive home.  Keep all follow-up visits as told by your health care provider. This is important. Contact a health care provider if:  You have a fever.  You have redness, swelling, or yellow drainage around your insertion site. Get help right away if:  You have unusual pain at the radial site.  The catheter insertion area swells very fast.  The insertion area is bleeding, and the bleeding does not stop when you hold steady pressure on the area.  Your arm or hand becomes pale, cool, tingly, or numb. These symptoms may represent a serious   problem that is an emergency. Do not wait to see if the symptoms will go away. Get medical help right away. Call your local emergency services (911 in the [place] Do not drive yourself to the hospital. Summary  After the procedure, it is common to have bruising and tenderness at the site.  Follow instructions from your health care provider about how to take care of your radial site wound. Check the wound every day for signs of infection.  Do not lift anything that is heavier than 10 lb (4.5 kg), or the limit that you are told, until your health care provider says  that it is safe. This information is not intended to replace advice given to you by your health care provider. Make sure you discuss any questions you have with your health care provider. Document Revised: 08/28/2017 Document Reviewed: 08/28/2017 Elsevier Patient Education  2020 Elsevier Inc.        Moderate Conscious Sedation, Adult, Care After These instructions provide you with information about caring for yourself after your procedure. Your health care provider may also give you more specific instructions. Your treatment has been planned according to current medical practices, but problems sometimes occur. Call your health care provider if you have any problems or questions after your procedure. What can I expect after the procedure? After your procedure, it is common:  To feel sleepy for several hours.  To feel clumsy and have poor balance for several hours.  To have poor judgment for several hours.  To vomit if you eat too soon. Follow these instructions at home: For at least 24 hours after the procedure:   Do not: ? Participate in activities where you could fall or become injured. ? Drive. ? Use heavy machinery. ? Drink alcohol. ? Take sleeping pills or medicines that cause drowsiness. ? Make important decisions or sign legal documents. ? Take care of children on your own.  Rest. Eating and drinking  Follow the diet recommended by your health care provider.  If you vomit: ? Drink water, juice, or soup when you can drink without vomiting. ? Make sure you have little or no nausea before eating solid foods. General instructions  Have a responsible adult stay with you until you are awake and alert.  Take over-the-counter and prescription medicines only as told by your health care provider.  If you smoke, do not smoke without supervision.  Keep all follow-up visits as told by your health care provider. This is important. Contact a health care provider  if:  You keep feeling nauseous or you keep vomiting.  You feel light-headed.  You develop a rash.  You have a fever. Get help right away if:  You have trouble breathing. This information is not intended to replace advice given to you by your health care provider. Make sure you discuss any questions you have with your health care provider. Document Revised: 07/05/2017 Document Reviewed: 11/12/2015 Elsevier Patient Education  2020 Elsevier Inc.  

## 2020-07-12 ENCOUNTER — Encounter: Payer: Self-pay | Admitting: Internal Medicine

## 2021-08-10 DIAGNOSIS — F419 Anxiety disorder, unspecified: Secondary | ICD-10-CM | POA: Diagnosis not present

## 2021-08-10 DIAGNOSIS — F4312 Post-traumatic stress disorder, chronic: Secondary | ICD-10-CM | POA: Diagnosis not present

## 2021-09-07 DIAGNOSIS — F4312 Post-traumatic stress disorder, chronic: Secondary | ICD-10-CM | POA: Diagnosis not present

## 2021-09-07 DIAGNOSIS — F419 Anxiety disorder, unspecified: Secondary | ICD-10-CM | POA: Diagnosis not present

## 2021-11-01 DIAGNOSIS — F4312 Post-traumatic stress disorder, chronic: Secondary | ICD-10-CM | POA: Diagnosis not present

## 2021-11-01 DIAGNOSIS — F419 Anxiety disorder, unspecified: Secondary | ICD-10-CM | POA: Diagnosis not present

## 2022-01-25 DIAGNOSIS — F4312 Post-traumatic stress disorder, chronic: Secondary | ICD-10-CM | POA: Diagnosis not present

## 2022-01-25 DIAGNOSIS — F419 Anxiety disorder, unspecified: Secondary | ICD-10-CM | POA: Diagnosis not present

## 2022-03-01 DIAGNOSIS — F4312 Post-traumatic stress disorder, chronic: Secondary | ICD-10-CM | POA: Diagnosis not present

## 2022-03-01 DIAGNOSIS — F419 Anxiety disorder, unspecified: Secondary | ICD-10-CM | POA: Diagnosis not present

## 2022-05-03 DIAGNOSIS — L02211 Cutaneous abscess of abdominal wall: Secondary | ICD-10-CM | POA: Diagnosis not present

## 2022-05-09 DIAGNOSIS — F419 Anxiety disorder, unspecified: Secondary | ICD-10-CM | POA: Diagnosis not present

## 2022-05-09 DIAGNOSIS — F4312 Post-traumatic stress disorder, chronic: Secondary | ICD-10-CM | POA: Diagnosis not present

## 2022-05-29 DIAGNOSIS — J00 Acute nasopharyngitis [common cold]: Secondary | ICD-10-CM | POA: Diagnosis not present

## 2022-08-09 DIAGNOSIS — R0981 Nasal congestion: Secondary | ICD-10-CM | POA: Diagnosis not present

## 2022-08-09 DIAGNOSIS — J029 Acute pharyngitis, unspecified: Secondary | ICD-10-CM | POA: Diagnosis not present

## 2022-08-09 DIAGNOSIS — H9203 Otalgia, bilateral: Secondary | ICD-10-CM | POA: Diagnosis not present

## 2022-08-09 DIAGNOSIS — Z20822 Contact with and (suspected) exposure to covid-19: Secondary | ICD-10-CM | POA: Diagnosis not present

## 2022-08-09 DIAGNOSIS — Z03818 Encounter for observation for suspected exposure to other biological agents ruled out: Secondary | ICD-10-CM | POA: Diagnosis not present

## 2022-10-01 DIAGNOSIS — J019 Acute sinusitis, unspecified: Secondary | ICD-10-CM | POA: Diagnosis not present

## 2022-10-01 DIAGNOSIS — R0981 Nasal congestion: Secondary | ICD-10-CM | POA: Diagnosis not present

## 2022-10-01 DIAGNOSIS — H1033 Unspecified acute conjunctivitis, bilateral: Secondary | ICD-10-CM | POA: Diagnosis not present

## 2022-11-13 ENCOUNTER — Encounter (HOSPITAL_COMMUNITY): Payer: Self-pay

## 2022-11-13 ENCOUNTER — Emergency Department (HOSPITAL_COMMUNITY): Payer: BC Managed Care – PPO

## 2022-11-13 ENCOUNTER — Emergency Department (HOSPITAL_COMMUNITY)
Admission: EM | Admit: 2022-11-13 | Discharge: 2022-11-13 | Disposition: A | Discharge status: Home / Self Care | Payer: BC Managed Care – PPO | Attending: Emergency Medicine | Admitting: Emergency Medicine

## 2022-11-13 DIAGNOSIS — Z8249 Family history of ischemic heart disease and other diseases of the circulatory system: Secondary | ICD-10-CM | POA: Insufficient documentation

## 2022-11-13 DIAGNOSIS — R079 Chest pain, unspecified: Secondary | ICD-10-CM | POA: Diagnosis not present

## 2022-11-13 DIAGNOSIS — Z79899 Other long term (current) drug therapy: Secondary | ICD-10-CM | POA: Diagnosis not present

## 2022-11-13 DIAGNOSIS — I10 Essential (primary) hypertension: Secondary | ICD-10-CM | POA: Insufficient documentation

## 2022-11-13 DIAGNOSIS — R0789 Other chest pain: Secondary | ICD-10-CM | POA: Diagnosis not present

## 2022-11-13 DIAGNOSIS — F419 Anxiety disorder, unspecified: Secondary | ICD-10-CM | POA: Insufficient documentation

## 2022-11-13 LAB — CBC
HCT: 39.5 % (ref 36.0–46.0)
Hemoglobin: 13.1 g/dL (ref 12.0–15.0)
MCH: 30.5 pg (ref 26.0–34.0)
MCHC: 33.2 g/dL (ref 30.0–36.0)
MCV: 91.9 fL (ref 80.0–100.0)
Platelets: 223 10*3/uL (ref 150–400)
RBC: 4.3 MIL/uL (ref 3.87–5.11)
RDW: 13.4 % (ref 11.5–15.5)
WBC: 8.7 10*3/uL (ref 4.0–10.5)
nRBC: 0 % (ref 0.0–0.2)

## 2022-11-13 LAB — I-STAT BETA HCG BLOOD, ED (MC, WL, AP ONLY): I-stat hCG, quantitative: <5 m[IU]/mL (ref <5)

## 2022-11-13 LAB — BASIC METABOLIC PANEL
Anion gap: 10 (ref 5–15)
BUN: 5 mg/dL — ABNORMAL LOW (ref 6–20)
CO2: 21 mmol/L — ABNORMAL LOW (ref 22–32)
Calcium: 8.7 mg/dL — ABNORMAL LOW (ref 8.9–10.3)
Chloride: 106 mmol/L (ref 98–111)
Creatinine, Ser: 0.58 mg/dL (ref 0.44–1.00)
GFR, Estimated: >60 mL/min (ref >60)
Glucose, Bld: 139 mg/dL — ABNORMAL HIGH (ref 70–99)
Potassium: 4 mmol/L (ref 3.5–5.1)
Sodium: 137 mmol/L (ref 135–145)

## 2022-11-13 LAB — TROPONIN I (HIGH SENSITIVITY)
Troponin I (High Sensitivity): 4 ng/L (ref <18)
Troponin I (High Sensitivity): 6 ng/L (ref <18)

## 2022-11-13 MED ORDER — LORAZEPAM 2 MG/ML IJ SOLN
1.0000 mg | Freq: Once | INTRAMUSCULAR | Status: AC
  Administered 2022-11-13: 1 mg via INTRAVENOUS
  Filled 2022-11-13: qty 1

## 2022-11-13 NOTE — Discharge Instructions (Signed)
Thank you for letting us take care of you today.   Overall, your workup today was reassuring including your EKG, blood work, cardiac enzymes which were normal.  I do believe that a stress of anxiety is contributing to your chest pain.  Please continue to follow-up with your mental health provider and therapist.  I also think you would benefit from seeing a cardiologist due to your risk factors and family history.  I have referred you to cardiology to be evaluated in the office.  They should call you within 72 hours to schedule an appointment.  If you do not hear from them, please call their office and schedule an appointment.  Please see your PCP within the next week to discuss your visit to the ED today.  Continue to take your medications at home as prescribed by your outpatient team.  If you develop any new or worsening symptoms such as worsening chest pain, problems breathing, vomiting, fever, lethargy, confusion, severe back pain or abdominal pain, or other new, concerning symptoms, please return to the nearest emergency department for reevaluation.

## 2022-11-13 NOTE — ED Provider Notes (Signed)
[place] EMERGENCY DEPARTMENT AT [facility] Provider Note   CSN: [id number] Arrival date & time: 11/13/22  1037     History    Toni Bowman is a 52 y.o. female with past medical history of hypertension, hyperlipidemia, PCOS, migraines, anxiety, depression, PTSD who presents to the ED complaining of chest pain that started at 09 30 this morning.  Patient reports that at that time she had onset of left-sided chest pain that radiated to her left jaw and down her left arm.  States that it has been constant since onset.  Mostly the pain is in the chest and is not reproducible by breathing, coughing, movement, or exertion.  She was at rest when pain began.  She denies associated cough, congestion, nausea, vomiting, diarrhea, shortness of breath, abdominal pain, back pain, diaphoresis, syncope, or other symptoms. She had left heart cath with normal coronaries, normal cath 07/11/2020.  Reports that in the same year she was evaluated in the outpatient setting by cardiology but was cleared by them.  She has had previous episodes of chest pain that are similar.  She believes that her PCP ordered a stress test within the last year or so that was normal but unable to find these results.  She does state that she is under increased stress and on my examination reports that she just got off the phone after having an argument with her brother.  She does have a family history of heart disease in both her mother and father.  Mother was diagnosed around age 49 and father was diagnosed around age 40.      Home Medications Prior to Admission medications   Medication Sig Start Date End Date Taking? Authorizing Provider  ALPRAZolam (XANAX) 0.25 MG tablet Take 0.25 mg by mouth 2 (two) times daily as needed for anxiety. 04/18/20  Yes [provider]  buPROPion (WELLBUTRIN XL) 150 MG 24 hr tablet Take 150 mg by mouth every morning. 11/01/22  Yes [provider]  cetirizine (ZYRTEC) 10 MG  tablet Take 10 mg by mouth daily as needed for allergies.    Yes [provider]  fluticasone (FLONASE) 50 MCG/ACT nasal spray Place 1 spray into both nostrils daily as needed for allergies.   Yes [provider]  Multiple Vitamin (MULTIVITAMIN WITH MINERALS) TABS Take 1 tablet by mouth daily.   Yes [provider]  traZODone (DESYREL) 50 MG tablet Take 25-50 mg by mouth at bedtime as needed for sleep. 06/10/20  Yes [provider]  Black Cohosh-SoyIsoflav-Magnol (ESTROVEN MENOPAUSE RELIEF) CAPS Take 1 capsule by mouth daily. Patient not taking: Reported on 11/13/2022    [provider]  escitalopram (LEXAPRO) 20 MG tablet Take 20 mg by mouth at bedtime. Patient not taking: Reported on 11/13/2022 06/10/20   [provider]  levonorgestrel (MIRENA) 20 MCG/24HR IUD 1 each by Intrauterine route once. Patient not taking: Reported on 11/13/2022    [provider]  metoprolol tartrate (LOPRESSOR) 25 MG tablet Take 25 mg by mouth daily. Patient not taking: Reported on 11/13/2022 06/15/20   [provider]  polyethylene glycol (MIRALAX) packet Take 17 g by mouth 2 (two) times daily. Patient not taking: Reported on 07/11/2020 06/04/17   Horton, Mayer Masker, MD  sodium phosphate (FLEET) 7-19 GM/118ML ENEM Place 133 mLs (1 enema total) rectally daily as needed for severe constipation. Patient not taking: Reported on 11/13/2022 06/04/17   Horton, Mayer Masker, MD  valACYclovir (VALTREX) 1000 MG tablet Take 1  tablet as directed Patient not taking: Reported on 11/13/2022 05/15/16   Jerene Bears, MD      Allergies    Patient has no known allergies.    Review of Systems   Review of Systems  All other systems reviewed and are negative.   Physical Exam Updated Vital Signs BP 123/71   Pulse 90   Temp 98.7 F (37.1 C) (Oral)   Resp 14   Ht 5\' 3"  (1.6 m)   Wt 117.9 kg   SpO2 100%   BMI 46.06 kg/m  Physical Exam Vitals and nursing note  reviewed.  Constitutional:      General: She is not in acute distress.    Appearance: Normal appearance. She is not ill-appearing, toxic-appearing or diaphoretic.  HENT:     Head: Normocephalic and atraumatic.     Nose: Nose normal.     Mouth/Throat:     Mouth: Mucous membranes are moist.     Pharynx: Oropharynx is clear. No oropharyngeal exudate or posterior oropharyngeal erythema.  Eyes:     General: No scleral icterus.    Extraocular Movements: Extraocular movements intact.     Conjunctiva/sclera: Conjunctivae normal.     Pupils: Pupils are equal, round, and reactive to light.  Cardiovascular:     Rate and Rhythm: Normal rate and regular rhythm.     Heart sounds: No murmur heard.    No gallop.  Pulmonary:     Effort: Pulmonary effort is normal. No respiratory distress.     Breath sounds: Normal breath sounds. No stridor. No wheezing, rhonchi or rales.  Chest:     Chest wall: No tenderness.  Abdominal:     General: Abdomen is flat. There is no distension.     Palpations: Abdomen is soft. There is no mass.     Tenderness: There is no abdominal tenderness. There is no right CVA tenderness, left CVA tenderness, guarding or rebound.  Musculoskeletal:        General: No swelling. Normal range of motion.     Cervical back: Normal range of motion and neck supple.     Right lower leg: No edema.     Left lower leg: No edema.     Comments: No lower extremity edema or tenderness bilaterally  Skin:    General: Skin is warm and dry.     Capillary Refill: Capillary refill takes less than 2 seconds.     Coloration: Skin is not jaundiced or pale.     Findings: No rash.  Neurological:     General: No focal deficit present.     Mental Status: She is alert and oriented to person, place, and time.     GCS: GCS eye subscore is 4. GCS verbal subscore is 5. GCS motor subscore is 6.     Cranial Nerves: No cranial nerve deficit, dysarthria or facial asymmetry.     Sensory: Sensation is intact.      Motor: Motor function is intact. No weakness, tremor, atrophy, abnormal muscle tone or seizure activity.     Coordination: Coordination is intact.     Gait: Gait is intact.  Psychiatric:        Attention and Perception: Attention normal.        Mood and Affect: Mood is anxious. Affect is flat and tearful.        Speech: Speech normal.        Behavior: Behavior is cooperative.        Thought Content: Thought  content normal.        Cognition and Memory: Cognition normal.        Judgment: Judgment normal.     ED Results / Procedures / Treatments   Labs (all labs ordered are listed, but only abnormal results are displayed) Labs Reviewed  BASIC METABOLIC PANEL - Abnormal; Notable for the following components:      Result Value   CO2 21 (*)    Glucose, Bld 139 (*)    BUN 5 (*)    Calcium 8.7 (*)    All other components within normal limits  CBC  I-STAT BETA HCG BLOOD, ED ([facility], [facility], [facility] ONLY)  TROPONIN I (HIGH SENSITIVITY)  TROPONIN I (HIGH SENSITIVITY)    EKG EKG Interpretation  Date/Time:  Tuesday November 13 2022 11:06:43 EDT Ventricular Rate:  77 PR Interval:  128 QRS Duration: 88 QT Interval:  382 QTC Calculation: 433 R Axis:   52 Text Interpretation: Sinus rhythm Low voltage, precordial leads No significant change since last tracing Confirmed by Alvira Monday ([id number]) on 11/13/2022 2:32:48 PM  Radiology DG Chest 2 View  Result Date: 11/13/2022 CLINICAL DATA:  Chest pain EXAM: CHEST - 2 VIEW COMPARISON:  Chest x-ray dated Dec 20, 2015 FINDINGS: The heart size and mediastinal contours are within normal limits. Both lungs are clear. The visualized skeletal structures are unremarkable. IMPRESSION: No active cardiopulmonary disease. Electronically Signed   By: Allegra Lai M.D.   On: 11/13/2022 11:37    Procedures Procedures    Medications Ordered in ED Medications  LORazepam (ATIVAN) injection 1 mg (has no administration in time range)    ED Course/ Medical  Decision Making/ A&P                             Medical Decision Making Amount and/or Complexity of Data Reviewed Labs: ordered. Decision-making details documented in ED Course. Radiology: ordered. Decision-making details documented in ED Course. ECG/medicine tests: ordered. Decision-making details documented in ED Course.  Risk Prescription drug management.   Medical Decision Making:   SAMINA NEVEL is a 52 y.o. female who presented to the ED today with chest pain detailed above.    Additional history discussed with patient's family/caregivers.  Patient's presentation is complicated by their history of family history of CAD, hypertension, hyperlipidemia.  Patient placed on continuous vitals and telemetry monitoring while in ED which was reviewed periodically.  Complete initial physical exam performed, notably the patient  was in no acute distress but anxious and tearful.  She had no chest wall tenderness to palpation.  Regular rate and rhythm.  Lungs clear to auscultation.  No lower extremity edema or tenderness. Neurologically intact.    Reviewed and confirmed nursing documentation for past medical history, family history, social history.    Initial Assessment:   With the patient's presentation of chest pain, the emergent differential diagnosis of chest pain includes: Acute coronary syndrome, pericarditis, aortic dissection, pulmonary embolism, tension pneumothorax, and esophageal rupture. I do not believe the patient has an emergent cause of chest pain, other urgent/non-acute considerations include, but are not limited to: chronic angina, aortic stenosis, cardiomyopathy, myocarditis, mitral valve prolapse, pulmonary hypertension, hypertrophic obstructive cardiomyopathy (HOCM), aortic insufficiency, right ventricular hypertrophy, pneumonia, pleuritis, bronchitis, pneumothorax, tumor, gastroesophageal reflux disease (GERD), esophageal spasm, Mallory-Weiss syndrome, peptic ulcer disease,  biliary disease, pancreatitis, functional gastrointestinal pain, cervical or thoracic disk disease or arthritis, shoulder arthritis, costochondritis, subacromial bursitis, anxiety or panic attack, herpes  zoster, breast disorders, chest wall tumors, thoracic outlet syndrome, mediastinitis.   This is most consistent with an acute complicated illness  Initial Plan:  Screening labs including CBC and Metabolic panel to evaluate for infectious or metabolic etiology of disease.  Urinalysis with reflex culture ordered to evaluate for UTI or relevant urologic/nephrologic pathology.  CXR to evaluate for structural/infectious intrathoracic pathology.  EKG and troponins to evaluate for cardiac pathology Objective evaluation as reviewed   Initial Study Results:   Laboratory  All laboratory results reviewed without evidence of clinically relevant pathology.    EKG EKG was reviewed independently. ST segments without concerns for elevations.   EKG: unchanged from previous tracings, normal sinus rhythm.   Radiology:  All images reviewed independently. Agree with radiology report at this time.   DG Chest 2 View  Result Date: 11/13/2022 CLINICAL DATA:  Chest pain EXAM: CHEST - 2 VIEW COMPARISON:  Chest x-ray dated Dec 20, 2015 FINDINGS: The heart size and mediastinal contours are within normal limits. Both lungs are clear. The visualized skeletal structures are unremarkable. IMPRESSION: No active cardiopulmonary disease. Electronically Signed   By: Allegra Lai M.D.   On: 11/13/2022 11:37     Final Assessment and Plan:   This is a 52 year old female who presents to the ED with abrupt onset of left-sided chest pain this morning.  Patient reports recent emotional stress but states that she was sitting when pain began and has not recently done any physical exertion.  Pain does radiate to left jaw and down left arm.  She has had similar episodes in the past for which she had a negative outpatient cardiac  evaluation.  She does have a family history of CAD and her parent as young at 65.  Normal heart cath and stress test in 2021.  Patient reports possible stress test since that time but unable to find this report.  On my exam, she has a regular rate and rhythm, lungs clear to auscultation, no reproducible chest wall pain.  No lower extremity edema or tenderness.  Patient is very anxious and tearful stating that she just got off the phone with a family member after an argumentative conversation.  Workup obtained as above for further assessment.  Workup essentially unremarkable with 2 negative troponins, negative chest x-ray, unremarkable blood work.  EKG normal sinus rhythm without acute ST-T changes.  On multiple reassessments, patient continues to be tearful expressing difficult family relations.  With this, discussed all of patient's findings and believes that she is stable to be discharged home.  She does have an outpatient mental health team including a psychiatric NP and therapist.  She will continue to follow-up with them.  Will have patient follow-up closely with primary care and with her risk factors were also refer her to cardiology for outpatient evaluation.  Dose of Ativan given to help with anxiety prior to discharge.  Patient given strict ED return precautions, all questions answered, and stable for discharge.   Clinical Impression:  1. Atypical chest pain   2. Anxiety   3. Family history of heart disease      Discharge           Final Clinical Impression(s) / ED Diagnoses Final diagnoses:  Atypical chest pain  Anxiety  Family history of heart disease    Rx / DC Orders ED Discharge Orders          Ordered    Ambulatory referral to Cardiology       Comments: If  you have not heard from the Cardiology office within the next 72 hours please call [phone].   11/13/22 1507              Tonette LedererGowens, Edrick Whitehorn L, PA-C 11/13/22 1519    Alvira MondaySchlossman, Erin, MD 11/13/22 2227

## 2022-11-13 NOTE — ED Notes (Signed)
Pt alert, NAD, calm, interactive, speaking clearly, resps e/u.

## 2022-11-13 NOTE — ED Triage Notes (Addendum)
Pt bib ems from work; c/o L jaw pain radiating to side L chest that started while sitting; denies hx of same; 12 lead unremarkable; cath in 2021; denies sob, no nausea; 324 asa given pta; 156/76, HR hx anxiety, endorses stressful evening yesterday; states previous anxiety episodes different; NSR, 80; pain improved, 3/10

## 2022-11-13 NOTE — ED Notes (Signed)
Denies pain. States, "just tired". Endorse some light headedness. Up to b/r, steady gait. Declines w/c.  Husband at [facility].

## 2022-11-28 DIAGNOSIS — F419 Anxiety disorder, unspecified: Secondary | ICD-10-CM | POA: Diagnosis not present

## 2022-11-28 DIAGNOSIS — F4312 Post-traumatic stress disorder, chronic: Secondary | ICD-10-CM | POA: Diagnosis not present

## 2022-12-07 DIAGNOSIS — F330 Major depressive disorder, recurrent, mild: Secondary | ICD-10-CM | POA: Diagnosis not present

## 2022-12-07 DIAGNOSIS — F4312 Post-traumatic stress disorder, chronic: Secondary | ICD-10-CM | POA: Diagnosis not present

## 2022-12-21 DIAGNOSIS — Z8249 Family history of ischemic heart disease and other diseases of the circulatory system: Secondary | ICD-10-CM | POA: Insufficient documentation

## 2023-01-01 ENCOUNTER — Encounter: Payer: Self-pay | Admitting: Cardiology

## 2023-01-01 ENCOUNTER — Ambulatory Visit: Payer: BC Managed Care – PPO | Attending: Cardiology | Admitting: Cardiology

## 2023-01-01 ENCOUNTER — Ambulatory Visit: Payer: BC Managed Care – PPO | Admitting: Cardiology

## 2023-01-01 VITALS — BP 136/74 | HR 72 | Ht 63.5 in | Wt 267.0 lb

## 2023-01-01 DIAGNOSIS — R072 Precordial pain: Secondary | ICD-10-CM

## 2023-01-01 DIAGNOSIS — Z8249 Family history of ischemic heart disease and other diseases of the circulatory system: Secondary | ICD-10-CM | POA: Diagnosis not present

## 2023-01-01 MED ORDER — METOPROLOL TARTRATE 100 MG PO TABS: 100.0000 mg | ORAL_TABLET | ORAL | 0 refills | Status: AC

## 2023-01-01 NOTE — Patient Instructions (Signed)
Medication Instructions:  The current medical regimen is effective;  continue present plan and medications.  *If you need a refill on your cardiac medications before your next appointment, please call your pharmacy*   Testing/Procedures:   Your cardiac CT will be scheduled at:   [facility] [address] [place], [place] [address] [phone]  Please arrive at the Women's and Children's Entrance (Entrance C2) of [facility] 30 minutes prior to test start time. You can use the FREE valet parking offered at entrance C (encouraged to control the heart rate for the test)  Proceed to the [place] [facility]iology Department (first floor) to check-in and test prep.  All radiology patients and guests should use entrance C2 at [facility], accessed from [address], even though the hospital's physical address listed is [address].     Please follow these instructions carefully (unless otherwise directed):  On the Night Before the Test: Be sure to Drink plenty of water. Do not consume any caffeinated/decaffeinated beverages or chocolate 12 hours prior to your test. Do not take any antihistamines 12 hours prior to your test.  On the Day of the Test: Drink plenty of water until 1 hour prior to the test. Do not eat any food 1 hour prior to test. You may take your regular medications prior to the test.  Take metoprolol (Lopressor) two hours prior to test. If you take Furosemide/Hydrochlorothiazide/Spironolactone, please HOLD on the morning of the test. FEMALES- please wear underwire-free bra if available, avoid dresses & tight clothing      After the Test: Drink plenty of water. After receiving IV contrast, you may experience a mild flushed feeling. This is normal. On occasion, you may experience a mild rash up to 24 hours after the test. This is not dangerous. If this occurs, you can take Benadryl 25 mg and increase your fluid  intake. If you experience trouble breathing, this can be serious. If it is severe call 911 IMMEDIATELY. If it is mild, please call our office. If you take any of these medications: Glipizide/Metformin, Avandament, Glucavance, please do not take 48 hours after completing test unless otherwise instructed.  We will call to schedule your test 2-4 weeks out understanding that some insurance companies will need an authorization prior to the service being performed.   For non-scheduling related questions, please contact the cardiac imaging nurse navigator should you have any questions/concerns: Sara Wallace, Cardiac Imaging Nurse Navigator Merle Prescott, Cardiac Imaging Nurse Navigator [facility] Direct Office Dial: [phone]   For scheduling needs, including cancellations and rescheduling, please call Brittany, [phone].   Follow-Up: At [facility], you and your health needs are our priority.  As part of our continuing mission to provide you with exceptional heart care, we have created designated Provider Care Teams.  These Care Teams include your primary Cardiologist (physician) and Advanced Practice Providers (APPs -  Physician Assistants and Nurse Practitioners) who all work together to provide you with the care you need, when you need it.  We recommend signing up for the patient portal called "MyChart".  Sign up information is provided on this After Visit Summary.  MyChart is used to connect with patients for Virtual Visits (Telemedicine).  Patients are able to view lab/test results, encounter notes, upcoming appointments, etc.  Non-urgent messages can be sent to your provider as well.   To learn more about what you can do with MyChart, go to [email or web address].      Your next appointment:   Follow up will be based on the results of the above testing.  

## 2023-01-01 NOTE — Progress Notes (Signed)
Cardiology Office Note:    Date:  01/01/2023   ID:  Toni Bowman, DOB 10/07/1970, MRN [id number]  PCP:  Lynnea Ferrier, MD   [facility] Providers Cardiologist:  None     Referring MD: Tonette Lederer, PA-C    History of Present Illness:    Toni Bowman is a 52 y.o. female here for evaluation of chest discomfort.  Has had prior cardiac catheterization in July 31 2020 that showed normal coronary arteries, post covid, stamina poor.   CP was intense. BP at work, felt like this wasn't happening. Tried to talk herself out. Left sided up Jaw and down arm. Steady pain. No sweats. Works at [employer], 250/182. Typically 120/80.   Father MI at 55, died 59 Mother died 66 CHF, MI Brother had CABG 44, 25-Jun-2023 died.  Sister died brain Cancer January 23, 2019 at 63 year.     Past Medical History:  Diagnosis Date   Anxiety    Dyspareunia    Family history of bladder cancer    Family history of brain cancer    Frequent headaches    Infertility, female    Migraines    PCOS (polycystic ovarian syndrome)    Pleurisy     Past Surgical History:  Procedure Laterality Date   CESAREAN SECTION     COLON SURGERY     EXPLORATORY LAPAROTOMY     LEFT HEART CATH AND CORONARY ANGIOGRAPHY Left 07/11/2020   Procedure: LEFT HEART CATH AND CORONARY ANGIOGRAPHY;  Surgeon: Alwyn Pea, MD;  Location: [facility] INVASIVE CV LAB;  Service: Cardiovascular;  Laterality: Left;    Current Medications: Current Meds  Medication Sig   ALPRAZolam (XANAX) 0.25 MG tablet Take 0.25 mg by mouth 2 (two) times daily as needed for anxiety.   Black Cohosh-SoyIsoflav-Magnol (ESTROVEN MENOPAUSE RELIEF) CAPS Take 1 capsule by mouth daily.   cetirizine (ZYRTEC) 10 MG tablet Take 10 mg by mouth daily as needed for allergies.    FLUoxetine (PROZAC) 40 MG capsule Take 40 mg by mouth daily.   fluticasone (FLONASE) 50 MCG/ACT nasal spray Place 1 spray into both nostrils daily as needed for  allergies.   Lavender Oil 80 MG CAPS Take 2 capsules by mouth at bedtime as needed.   levonorgestrel (MIRENA) 20 MCG/24HR IUD 1 each by Intrauterine route once.   metoprolol tartrate (LOPRESSOR) 100 MG tablet Take 1 tablet (100 mg total) by mouth as directed. Take one tablet (2) hours before your CT scan   Multiple Vitamin (MULTIVITAMIN WITH MINERALS) TABS Take 1 tablet by mouth daily.   polyethylene glycol (MIRALAX) packet Take 17 g by mouth 2 (two) times daily.   sodium phosphate (FLEET) 7-19 GM/118ML ENEM Place 133 mLs (1 enema total) rectally daily as needed for severe constipation.   traZODone (DESYREL) 50 MG tablet Take 25-50 mg by mouth at bedtime as needed for sleep.   valACYclovir (VALTREX) 1000 MG tablet Take 1 tablet as directed     Allergies:   Patient has no known allergies.   Social History   Socioeconomic History   Marital status: Married    Spouse name: Not on file   Number of children: Not on file   Years of education: Not on file   Highest education level: Not on file  Occupational History   Not on file  Tobacco Use   Smoking status: Never   Smokeless tobacco: Never  Vaping Use   Vaping Use: Never used  Substance  and Sexual Activity   Alcohol use: Yes    Alcohol/week: 3.0 standard drinks of alcohol    Types: 3 Standard drinks or equivalent per week    Comment: weekends   Drug use: No   Sexual activity: Yes    Partners: Male    Birth control/protection: Condom, I.U.D.    Comment: Mirena IUD  Other Topics Concern   Not on file  Social History Narrative   Not on file   Social Determinants of Health   Financial Resource Strain: Not on file  Food Insecurity: Not on file  Transportation Needs: Not on file  Physical Activity: Not on file  Stress: Not on file  Social Connections: Not on file     Family History: The patient's family history includes Alzheimer's disease in her paternal grandmother; Bladder Cancer in her cousin; Brain cancer (age of onset:  1) in her cousin; Brain cancer (age of onset: 4) in her sister; Cancer in her maternal uncle; Dementia in her paternal grandmother and paternal uncle; Dysrhythmia in her mother; Head & neck cancer (age of onset: 20) in her paternal aunt; Heart attack in her brother, father, and mother; Heart failure in her brother and father; Hyperlipidemia in her brother, mother, sister, and sister; Hypertension in her brother, father, mother, and sister; Leukemia in her cousin; Throat cancer (age of onset: 29) in her brother; Thyroid disease in her mother.  ROS:   Please see the history of present illness.     All other systems reviewed and are negative.  EKGs/Labs/Other Studies Reviewed:    The following studies were reviewed today: ER notes reviewed lab work reviewed troponins were normal  EKG: Sinus rhythm nonischemic EKG  Recent Labs: 11/13/2022: BUN 5; Creatinine, Ser 0.58; Hemoglobin 13.1; Platelets 223; Potassium 4.0; Sodium 137  Recent Lipid Panel    Component Value Date/Time   CHOL 180 01/25/2014 1016   TRIG 82 01/25/2014 1016   HDL 49 01/25/2014 1016   CHOLHDL 3.7 01/25/2014 1016   VLDL 16 01/25/2014 1016   LDLCALC 115 (H) 01/25/2014 1016     Risk Assessment/Calculations:               Physical Exam:    VS:  BP 136/74   Pulse 72   Ht 5' 3.5" (1.613 m)   Wt 267 lb (121.1 kg)   SpO2 96%   BMI 46.56 kg/m     Wt Readings from Last 3 Encounters:  01/01/23 267 lb (121.1 kg)  11/13/22 260 lb (117.9 kg)  07/11/20 272 lb (123.4 kg)     GEN:  Well nourished, well developed in no acute distress HEENT: Normal NECK: No JVD; No carotid bruits LYMPHATICS: No lymphadenopathy CARDIAC: RRR, no murmurs, rubs, gallops RESPIRATORY:  Clear to auscultation without rales, wheezing or rhonchi  ABDOMEN: Soft, non-tender, non-distended MUSCULOSKELETAL:  No edema; No deformity  SKIN: Warm and dry NEUROLOGIC:  Alert and oriented x 3 PSYCHIATRIC:  Normal affect, teary-eyed  ASSESSMENT:     1. Precordial pain   2. Family history of heart disease    PLAN:    In order of problems listed above:  Precordial chest pain/jaw pain - We will go ahead and check a coronary CT scan since it has been a few years since coronary assessment.  Very strong family history of CAD/MI.  Secondary risk factor prevention pending upon completion of scan.  Transient elevation in blood pressure - Blood pressure normally is in the 110 range systolic.  Excellent.  During periods of high anxiety with chest pressure, jaw pain left arm pain blood pressure was highly elevated.  Slowly returned to normal.  Currently reassuring.           Medication Adjustments/Labs and Tests Ordered: Current medicines are reviewed at length with the patient today.  Concerns regarding medicines are outlined above.  Orders Placed This Encounter  Procedures   CT CORONARY MORPH W/CTA COR W/SCORE W/CA W/CM &/OR WO/CM   Meds ordered this encounter  Medications   metoprolol tartrate (LOPRESSOR) 100 MG tablet    Sig: Take 1 tablet (100 mg total) by mouth as directed. Take one tablet (2) hours before your CT scan    Dispense:  1 tablet    Refill:  0    Patient Instructions  Medication Instructions:  The current medical regimen is effective;  continue present plan and medications.  *If you need a refill on your cardiac medications before your next appointment, please call your pharmacy*  Testing/Procedures:   Your cardiac CT will be scheduled at:   [facility] [address] [place], [place] [address] [phone]  Please arrive at the [facility]'s Entrance (Entrance C2) of [facility] 30 minutes prior to test start time. You can use the FREE valet parking offered at entrance C (encouraged to control the heart rate for the test)  Proceed to the [facility] Radiology Department (first floor) to check-in and test prep.  All radiology patients and guests should use entrance C2  at [facility], accessed from [facility], even though the hospital's physical address listed is [address]    Please follow these instructions carefully (unless otherwise directed):  On the Night Before the Test: Be sure to Drink plenty of water. Do not consume any caffeinated/decaffeinated beverages or chocolate 12 hours prior to your test. Do not take any antihistamines 12 hours prior to your test.  On the Day of the Test: Drink plenty of water until 1 hour prior to the test. Do not eat any food 1 hour prior to test. You may take your regular medications prior to the test.  Take metoprolol (Lopressor) two hours prior to test. If you take Furosemide/Hydrochlorothiazide/Spironolactone, please HOLD on the morning of the test. FEMALES- please wear underwire-free bra if available, avoid dresses & tight clothing      After the Test: Drink plenty of water. After receiving IV contrast, you may experience a mild flushed feeling. This is normal. On occasion, you may experience a mild rash up to 24 hours after the test. This is not dangerous. If this occurs, you can take Benadryl 25 mg and increase your fluid intake. If you experience trouble breathing, this can be serious. If it is severe call 911 IMMEDIATELY. If it is mild, please call our office. If you take any of these medications: Glipizide/Metformin, Avandament, Glucavance, please do not take 48 hours after completing test unless otherwise instructed.  We will call to schedule your test 2-4 weeks out understanding that some insurance companies will need an authorization prior to the service being performed.   For non-scheduling related questions, please contact the cardiac imaging nurse navigator should you have any questions/concerns: Rockwell [place], Cardiac Imaging Nurse Navigator Larey Brick, Cardiac Imaging Nurse Navigator [facility] Direct Office Dial: [phone]    For scheduling needs, including cancellations and rescheduling, please call [place], [phone].   Follow-Up: At [facility], you and your health needs  are our priority.  As part of our continuing mission to provide you with exceptional heart care, we have created designated Provider Care Teams.  These Care Teams include your primary Cardiologist (physician) and Advanced Practice Providers (APPs -  Physician Assistants and Nurse Practitioners) who all work together to provide you with the care you need, when you need it.  We recommend signing up for the patient portal called "MyChart".  Sign up information is provided on this After Visit Summary.  MyChart is used to connect with patients for Virtual Visits (Telemedicine).  Patients are able to view lab/test results, encounter notes, upcoming appointments, etc.  Non-urgent messages can be sent to your provider as well.   To learn more about what you can do with MyChart, go to [email or web address].    Your next appointment:   Follow up will be based on the results of the above testing.     Signed, Donato Schultz, MD  01/01/2023 4:38 PM    [facility]

## 2023-01-01 NOTE — Progress Notes (Deleted)
Cardiology Office Note:    Date:  01/01/2023   ID:  Toni Bowman, DOB 11-11-1970, MRN [id number]  PCP:  Lynnea Ferrier, MD   [facility] Providers Cardiologist:  None     Referring MD: Tonette Lederer, PA-C    History of Present Illness:    Toni Bowman is a 52 y.o. female here for the evaluation of chest pain at the request of Dr. Worthy Keeler.  Has history of hypertension hyperlipidemia migraines anxiety depression PTSD.  Chest pain was described as onset in the morning left-sided radiating to her left jaw and down her left arm constant on 11/13/2022 which resulted in ER visit.  She was at rest when it began.  Increased stress.  Just had an argument with her brother at that time.  Mother was diagnosed with coronary disease around age 59 and father was diagnosed around age 52.  Troponins were normal x-ray was normal other blood work normal.  EKG showed no acute ST segment changes.  She was tearful.  Ativan was given to help with anxiety.  Cardiac catheterization 12/10/2019: Normal coronary arteries.  Past Medical History:  Diagnosis Date   Anxiety    Dyspareunia    Family history of bladder cancer    Family history of brain cancer    Frequent headaches    Infertility, female    Migraines    PCOS (polycystic ovarian syndrome)    Pleurisy     Past Surgical History:  Procedure Laterality Date   CESAREAN SECTION     COLON SURGERY     EXPLORATORY LAPAROTOMY     LEFT HEART CATH AND CORONARY ANGIOGRAPHY Left 07/11/2020   Procedure: LEFT HEART CATH AND CORONARY ANGIOGRAPHY;  Surgeon: Alwyn Pea, MD;  Location: [facility] INVASIVE CV LAB;  Service: Cardiovascular;  Laterality: Left;    Current Medications: No outpatient medications have been marked as taking for the 01/01/23 encounter (Appointment) with Jake Bathe, MD.     Allergies:   Patient has no known allergies.   Social History   Socioeconomic History   Marital status: Married    Spouse name: Not  on file   Number of children: Not on file   Years of education: Not on file   Highest education level: Not on file  Occupational History   Not on file  Tobacco Use   Smoking status: Never   Smokeless tobacco: Never  Vaping Use   Vaping Use: Never used  Substance and Sexual Activity   Alcohol use: Yes    Alcohol/week: 3.0 standard drinks of alcohol    Types: 3 Standard drinks or equivalent per week    Comment: weekends   Drug use: No   Sexual activity: Yes    Partners: Male    Birth control/protection: Condom, I.U.D.    Comment: Mirena IUD  Other Topics Concern   Not on file  Social History Narrative   Not on file   Social Determinants of Health   Financial Resource Strain: Not on file  Food Insecurity: Not on file  Transportation Needs: Not on file  Physical Activity: Not on file  Stress: Not on file  Social Connections: Not on file     Family History: The patient's ***family history includes Alzheimer's disease in her paternal grandmother; Bladder Cancer in her cousin; Brain cancer (age of onset: 1) in her cousin; Brain cancer (age of onset: 45) in her sister; Cancer in her maternal uncle; Dementia in her paternal  grandmother and paternal uncle; Dysrhythmia in her mother; Head & neck cancer (age of onset: 42) in her paternal aunt; Heart attack in her brother, father, and mother; Heart failure in her brother and father; Hyperlipidemia in her brother, mother, sister, and sister; Hypertension in her brother, father, mother, and sister; Leukemia in her cousin; Throat cancer (age of onset: 28) in her brother; Thyroid disease in her mother.  ROS:   Please see the history of present illness.    *** All other systems reviewed and are negative.  EKGs/Labs/Other Studies Reviewed:    The following studies were reviewed today: ***  EKG:  The ekg ordered today demonstrates ***  Recent Labs: 11/13/2022: BUN 5; Creatinine, Ser 0.58; Hemoglobin 13.1; Platelets 223; Potassium 4.0;  Sodium 137  Recent Lipid Panel    Component Value Date/Time   CHOL 180 01/25/2014 1016   TRIG 82 01/25/2014 1016   HDL 49 01/25/2014 1016   CHOLHDL 3.7 01/25/2014 1016   VLDL 16 01/25/2014 1016   LDLCALC 115 (H) 01/25/2014 1016     Risk Assessment/Calculations:   {Does this patient have ATRIAL FIBRILLATION?:[id number]}  No BP recorded.  {Refresh Note OR Click here to enter BP  :1}***         Physical Exam:    VS:  There were no vitals taken for this visit.    Wt Readings from Last 3 Encounters:  11/13/22 260 lb (117.9 kg)  07/11/20 272 lb (123.4 kg)  06/03/17 235 lb (106.6 kg)     GEN: *** Well nourished, well developed in no acute distress HEENT: Normal NECK: No JVD; No carotid bruits LYMPHATICS: No lymphadenopathy CARDIAC: ***RRR, no murmurs, rubs, gallops RESPIRATORY:  Clear to auscultation without rales, wheezing or rhonchi  ABDOMEN: Soft, non-tender, non-distended MUSCULOSKELETAL:  No edema; No deformity  SKIN: Warm and dry NEUROLOGIC:  Alert and oriented x 3 PSYCHIATRIC:  Normal affect   ASSESSMENT:    No diagnosis found. PLAN:    In order of problems listed above:  ***      {Are you ordering a CV Procedure (e.g. stress test, cath, DCCV, TEE, etc)?   Press F2        :[id number]}    Medication Adjustments/Labs and Tests Ordered: Current medicines are reviewed at length with the patient today.  Concerns regarding medicines are outlined above.  No orders of the defined types were placed in this encounter.  No orders of the defined types were placed in this encounter.   There are no Patient Instructions on file for this visit.   Signed, Donato Schultz, MD  01/01/2023 8:57 AM    [facility]

## 2023-01-08 ENCOUNTER — Telehealth (HOSPITAL_COMMUNITY): Payer: Self-pay | Admitting: *Deleted

## 2023-01-08 NOTE — Telephone Encounter (Signed)
Reaching out to patient to offer assistance regarding upcoming cardiac imaging study; pt verbalizes understanding of appt date/time, parking situation and where to check in, pre-test NPO status and medications ordered, and verified current allergies; name and call back number provided for further questions should they arise  Omeka Holben RN Navigator Cardiac Imaging [facility] [phone] office [phone] cell  Patient to take 100mg metoprolol tartrate two hours prior to her cardiac CT scan. She is aware to arrive at 1pm. 

## 2023-01-08 NOTE — Telephone Encounter (Signed)
Attempted to call patient regarding upcoming cardiac CT appointment. °Left message on voicemail with name and callback number ° °Jacklyne Baik RN Navigator Cardiac Imaging °[facility] °[phone] Office °[phone] Cell ° °

## 2023-01-09 ENCOUNTER — Ambulatory Visit (HOSPITAL_COMMUNITY)
Admission: RE | Admit: 2023-01-09 | Discharge: 2023-01-09 | Disposition: A | Discharge status: Home / Self Care | Payer: BC Managed Care – PPO | Source: Ambulatory Visit | Attending: Cardiology | Admitting: Cardiology

## 2023-01-09 DIAGNOSIS — R072 Precordial pain: Secondary | ICD-10-CM | POA: Diagnosis not present

## 2023-01-09 DIAGNOSIS — Z8249 Family history of ischemic heart disease and other diseases of the circulatory system: Secondary | ICD-10-CM | POA: Diagnosis not present

## 2023-01-09 MED ORDER — NITROGLYCERIN 0.4 MG SL SUBL
SUBLINGUAL_TABLET | SUBLINGUAL | Status: AC
  Filled 2023-01-09: qty 2

## 2023-01-09 MED ORDER — NITROGLYCERIN 0.4 MG SL SUBL
0.8000 mg | SUBLINGUAL_TABLET | Freq: Once | SUBLINGUAL | Status: AC
  Administered 2023-01-09: 0.8 mg via SUBLINGUAL

## 2023-01-09 MED ORDER — IOHEXOL 350 MG/ML SOLN
95.0000 mL | Freq: Once | INTRAVENOUS | Status: AC | PRN
  Administered 2023-01-09: 95 mL via INTRAVENOUS

## 2023-03-04 DIAGNOSIS — F419 Anxiety disorder, unspecified: Secondary | ICD-10-CM | POA: Diagnosis not present

## 2023-03-04 DIAGNOSIS — F4312 Post-traumatic stress disorder, chronic: Secondary | ICD-10-CM | POA: Diagnosis not present

## 2023-04-18 DIAGNOSIS — R051 Acute cough: Secondary | ICD-10-CM | POA: Diagnosis not present

## 2023-07-18 DIAGNOSIS — F419 Anxiety disorder, unspecified: Secondary | ICD-10-CM | POA: Diagnosis not present

## 2023-07-18 DIAGNOSIS — F4312 Post-traumatic stress disorder, chronic: Secondary | ICD-10-CM | POA: Diagnosis not present

## 2023-08-09 DIAGNOSIS — F4312 Post-traumatic stress disorder, chronic: Secondary | ICD-10-CM | POA: Diagnosis not present

## 2023-08-09 DIAGNOSIS — F419 Anxiety disorder, unspecified: Secondary | ICD-10-CM | POA: Diagnosis not present

## 2023-09-12 DIAGNOSIS — R051 Acute cough: Secondary | ICD-10-CM | POA: Diagnosis not present

## 2023-09-12 DIAGNOSIS — J020 Streptococcal pharyngitis: Secondary | ICD-10-CM | POA: Diagnosis not present

## 2023-09-23 DIAGNOSIS — F4312 Post-traumatic stress disorder, chronic: Secondary | ICD-10-CM | POA: Diagnosis not present

## 2023-09-23 DIAGNOSIS — F419 Anxiety disorder, unspecified: Secondary | ICD-10-CM | POA: Diagnosis not present

## 2023-11-22 DIAGNOSIS — M545 Low back pain, unspecified: Secondary | ICD-10-CM | POA: Diagnosis not present

## 2023-11-26 DIAGNOSIS — M25551 Pain in right hip: Secondary | ICD-10-CM | POA: Diagnosis not present

## 2023-12-21 DIAGNOSIS — J069 Acute upper respiratory infection, unspecified: Secondary | ICD-10-CM | POA: Diagnosis not present
